# Patient Record
Sex: Male | Born: 2000 | Race: Black or African American | Hispanic: No | Marital: Single | Smoking: Never smoker
Health system: Southern US, Community
[De-identification: ages and names within clinical notes are randomized; demographics above are authoritative.]

## PROBLEM LIST (undated history)

## (undated) DIAGNOSIS — E669 Obesity, unspecified: Secondary | ICD-10-CM

## (undated) HISTORY — PX: LEG SURGERY: SHX1003

---
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
## 2019-09-29 ENCOUNTER — Ambulatory Visit: Payer: Self-pay | Admitting: Orthopedic Surgery

## 2019-10-06 ENCOUNTER — Ambulatory Visit (INDEPENDENT_AMBULATORY_CARE_PROVIDER_SITE_OTHER): Payer: Medicaid Other | Admitting: Orthopedic Surgery

## 2019-10-06 ENCOUNTER — Ambulatory Visit (INDEPENDENT_AMBULATORY_CARE_PROVIDER_SITE_OTHER): Payer: Medicaid Other

## 2019-10-06 ENCOUNTER — Other Ambulatory Visit: Payer: Self-pay

## 2019-10-06 ENCOUNTER — Encounter: Payer: Self-pay | Admitting: Orthopedic Surgery

## 2019-10-06 ENCOUNTER — Ambulatory Visit: Payer: Self-pay

## 2019-10-06 VITALS — Ht 70.0 in | Wt 380.0 lb

## 2019-10-06 DIAGNOSIS — M79605 Pain in left leg: Secondary | ICD-10-CM

## 2019-10-06 DIAGNOSIS — M79604 Pain in right leg: Secondary | ICD-10-CM

## 2019-10-06 DIAGNOSIS — M170 Bilateral primary osteoarthritis of knee: Secondary | ICD-10-CM

## 2019-10-08 ENCOUNTER — Encounter: Payer: Self-pay | Admitting: Orthopedic Surgery

## 2019-10-08 NOTE — Progress Notes (Signed)
Office Visit Note   Patient: Charles Petersen           Date of Birth: 09/10/2001           MRN: [id number] Visit Date: 10/06/2019 Requested by: Assunta Gambles, MD [address] [place],  [place] [address] PCP: Assunta Gambles, MD (Inactive)  Subjective: Chief Complaint  Patient presents with  . Right Leg - Pain  . Left Leg - Pain    HPI: Charles Petersen is a patient here for second opinion regarding his knees.  Deformity was noted by the mother when he was around age 89.  He was seen at [facility] and epiphysiodesis was performed at that time. 18  He was seen in 2017 and those notes are reviewed.  Due to his weight and high chance for complication no surgery was recommended.  His pain and deformity has increased in the past 3 years.  Currently he has 510 and 350.  Describes left greater than right sided pain.              ROS: All systems reviewed are negative as they relate to the chief complaint within the history of present illness.  Patient denies  fevers or chills.   Assessment & Plan: Visit Diagnoses:  1. Bilateral leg pain   2. Primary osteoarthritis of both knees     Plan: Impression is severe bilateral varus deformity of the knees status post epiphysiodesis done at age 18.  Patient has progressive varus deformity and increased body mass index.  Worse on the left knee than the right.  This is a significant life altering type of problem for Charles Petersen.  I am not sure if anything can be done.  Whether or not any type of talar spatial frame deformity correction could be considered is really up in the air.  I would favor surgical evaluation with Dr. Chiquita Loth or 1 of the pediatric orthopedic surgeons at [facility] to manage this very complex problem.  Follow-Up Instructions: Return if symptoms worsen or fail to improve.   Orders:  Orders Placed This Encounter  Procedures  . XR Knee 1-2 Views Right  . XR Knee 1-2 Views Left  . XR Tibia/Fibula Left  . XR Tibia/Fibula Right  . Ambulatory  referral to Orthopedic Surgery   No orders of the defined types were placed in this encounter.     Procedures: No procedures performed   Clinical Data: No additional findings.  Objective: Vital Signs: Ht 5\' 10"  (1.778 m)   Wt (!) 380 lb (172.4 kg)   BMI 54.52 kg/m   Physical Exam:   Constitutional: Patient appears well-developed HEENT:  Head: Normocephalic Eyes:EOM are normal Neck: Normal range of motion Cardiovascular: Normal rate Pulmonary/chest: Effort normal Neurologic: Patient is alert Skin: Skin is warm Psychiatric: Patient has normal mood and affect    Ortho Exam: Ortho exam demonstrates increased body mass index with severe varus deformity in both knees.  Patient lacks about 20 degrees of full extension on the left and about 10 degrees on the right.  Flexion is just barely to 90 degrees bilaterally.  Pedal pulses are palpable.  Specialty Comments:  No specialty comments available.  Imaging: No results found.   PMFS History: There are no active problems to display for this patient.  History reviewed. No pertinent past medical history.  History reviewed. No pertinent family history.  Past Surgical History:  Procedure Laterality Date  . LEG SURGERY     Social History   Occupational  History  . Not on file  Tobacco Use  . Smoking status: Never Smoker  Substance and Sexual Activity  . Alcohol use: No  . Drug use: No  . Sexual activity: Not on file

## 2020-08-16 ENCOUNTER — Emergency Department (HOSPITAL_BASED_OUTPATIENT_CLINIC_OR_DEPARTMENT_OTHER)
Admission: EM | Admit: 2020-08-16 | Discharge: 2020-08-16 | Disposition: A | Discharge status: Home / Self Care | Payer: Medicaid Other | Attending: Emergency Medicine | Admitting: Emergency Medicine

## 2020-08-16 ENCOUNTER — Other Ambulatory Visit: Payer: Self-pay

## 2020-08-16 ENCOUNTER — Encounter (HOSPITAL_BASED_OUTPATIENT_CLINIC_OR_DEPARTMENT_OTHER): Payer: Self-pay | Admitting: Emergency Medicine

## 2020-08-16 DIAGNOSIS — Z5321 Procedure and treatment not carried out due to patient leaving prior to being seen by health care provider: Secondary | ICD-10-CM | POA: Diagnosis not present

## 2020-08-16 DIAGNOSIS — R1031 Right lower quadrant pain: Secondary | ICD-10-CM | POA: Insufficient documentation

## 2020-08-16 HISTORY — DX: Obesity, unspecified: E66.9

## 2020-08-16 LAB — URINALYSIS, ROUTINE W REFLEX MICROSCOPIC
Bilirubin Urine: NEGATIVE
Glucose, UA: NEGATIVE mg/dL
Hgb urine dipstick: NEGATIVE
Ketones, ur: NEGATIVE mg/dL
Leukocytes,Ua: NEGATIVE
Nitrite: NEGATIVE
Protein, ur: NEGATIVE mg/dL
Specific Gravity, Urine: 1.025 (ref 1.005–1.030)
pH: 6 (ref 5.0–8.0)

## 2020-08-16 NOTE — ED Triage Notes (Signed)
Constant nagging pain in right flank radiating into RLQ x4  Days.  Denies any urinary sx. Denies n/v/d.

## 2021-09-17 ENCOUNTER — Encounter (HOSPITAL_BASED_OUTPATIENT_CLINIC_OR_DEPARTMENT_OTHER): Payer: Self-pay | Admitting: *Deleted

## 2021-09-17 ENCOUNTER — Emergency Department (HOSPITAL_BASED_OUTPATIENT_CLINIC_OR_DEPARTMENT_OTHER)
Admission: EM | Admit: 2021-09-17 | Discharge: 2021-09-17 | Disposition: A | Discharge status: Home / Self Care | Payer: No Typology Code available for payment source | Attending: Emergency Medicine | Admitting: Emergency Medicine

## 2021-09-17 ENCOUNTER — Other Ambulatory Visit: Payer: Self-pay

## 2021-09-17 DIAGNOSIS — R509 Fever, unspecified: Secondary | ICD-10-CM | POA: Diagnosis not present

## 2021-09-17 DIAGNOSIS — R110 Nausea: Secondary | ICD-10-CM | POA: Insufficient documentation

## 2021-09-17 DIAGNOSIS — R739 Hyperglycemia, unspecified: Secondary | ICD-10-CM | POA: Diagnosis not present

## 2021-09-17 DIAGNOSIS — E871 Hypo-osmolality and hyponatremia: Secondary | ICD-10-CM | POA: Diagnosis not present

## 2021-09-17 DIAGNOSIS — H538 Other visual disturbances: Secondary | ICD-10-CM | POA: Insufficient documentation

## 2021-09-17 DIAGNOSIS — R3589 Other polyuria: Secondary | ICD-10-CM | POA: Diagnosis not present

## 2021-09-17 DIAGNOSIS — R631 Polydipsia: Secondary | ICD-10-CM | POA: Insufficient documentation

## 2021-09-17 LAB — BASIC METABOLIC PANEL
Anion gap: 13 (ref 5–15)
BUN: 14 mg/dL (ref 6–20)
CO2: 26 mmol/L (ref 22–32)
Calcium: 9.2 mg/dL (ref 8.9–10.3)
Chloride: 100 mmol/L (ref 98–111)
Creatinine, Ser: 1.21 mg/dL (ref 0.61–1.24)
GFR, Estimated: >60 mL/min (ref >60)
Glucose, Bld: 345 mg/dL — ABNORMAL HIGH (ref 70–99)
Potassium: 4.4 mmol/L (ref 3.5–5.1)
Sodium: 139 mmol/L (ref 135–145)

## 2021-09-17 LAB — CBC WITH DIFFERENTIAL/PLATELET
Abs Immature Granulocytes: 0.03 10*3/uL (ref 0.00–0.07)
Basophils Absolute: 0 10*3/uL (ref 0.0–0.1)
Basophils Relative: 0 %
Eosinophils Absolute: 0.1 10*3/uL (ref 0.0–0.5)
Eosinophils Relative: 1 %
HCT: 48.1 % (ref 39.0–52.0)
Hemoglobin: 17.2 g/dL — ABNORMAL HIGH (ref 13.0–17.0)
Immature Granulocytes: 0 %
Lymphocytes Relative: 23 %
Lymphs Abs: 2.2 10*3/uL (ref 0.7–4.0)
MCH: 30.9 pg (ref 26.0–34.0)
MCHC: 35.8 g/dL (ref 30.0–36.0)
MCV: 86.5 fL (ref 80.0–100.0)
Monocytes Absolute: 0.5 10*3/uL (ref 0.1–1.0)
Monocytes Relative: 6 %
Neutro Abs: 6.6 10*3/uL (ref 1.7–7.7)
Neutrophils Relative %: 70 %
Platelets: 314 10*3/uL (ref 150–400)
RBC: 5.56 MIL/uL (ref 4.22–5.81)
RDW: 11.8 % (ref 11.5–15.5)
WBC: 9.5 10*3/uL (ref 4.0–10.5)
nRBC: 0 % (ref 0.0–0.2)

## 2021-09-17 LAB — COMPREHENSIVE METABOLIC PANEL
ALT: 46 U/L — ABNORMAL HIGH (ref 0–44)
AST: 29 U/L (ref 15–41)
Albumin: 4.9 g/dL (ref 3.5–5.0)
Alkaline Phosphatase: 114 U/L (ref 38–126)
Anion gap: 16 — ABNORMAL HIGH (ref 5–15)
BUN: 16 mg/dL (ref 6–20)
CO2: 27 mmol/L (ref 22–32)
Calcium: 10.9 mg/dL — ABNORMAL HIGH (ref 8.9–10.3)
Chloride: 91 mmol/L — ABNORMAL LOW (ref 98–111)
Creatinine, Ser: 1.3 mg/dL — ABNORMAL HIGH (ref 0.61–1.24)
GFR, Estimated: >60 mL/min (ref >60)
Glucose, Bld: 560 mg/dL (ref 70–99)
Potassium: 4.3 mmol/L (ref 3.5–5.1)
Sodium: 134 mmol/L — ABNORMAL LOW (ref 135–145)
Total Bilirubin: 1.2 mg/dL (ref 0.3–1.2)
Total Protein: 9 g/dL — ABNORMAL HIGH (ref 6.5–8.1)

## 2021-09-17 LAB — I-STAT VENOUS BLOOD GAS, ED
Acid-Base Excess: 3 mmol/L — ABNORMAL HIGH (ref 0.0–2.0)
Bicarbonate: 29.6 mmol/L — ABNORMAL HIGH (ref 20.0–28.0)
Calcium, Ion: 1.3 mmol/L (ref 1.15–1.40)
HCT: 51 % (ref 39.0–52.0)
Hemoglobin: 17.3 g/dL — ABNORMAL HIGH (ref 13.0–17.0)
O2 Saturation: 52 %
Potassium: 4.4 mmol/L (ref 3.5–5.1)
Sodium: 135 mmol/L (ref 135–145)
TCO2: 31 mmol/L (ref 22–32)
pCO2, Ven: 51.5 mmHg (ref 44.0–60.0)
pH, Ven: 7.368 (ref 7.250–7.430)
pO2, Ven: 29 mmHg — CL (ref 32.0–45.0)

## 2021-09-17 LAB — URINALYSIS, ROUTINE W REFLEX MICROSCOPIC
Bilirubin Urine: NEGATIVE
Glucose, UA: >=500 mg/dL — AB
Ketones, ur: 80 mg/dL — AB
Leukocytes,Ua: NEGATIVE
Nitrite: NEGATIVE
Protein, ur: NEGATIVE mg/dL
Specific Gravity, Urine: 1.015 (ref 1.005–1.030)
pH: 5 (ref 5.0–8.0)

## 2021-09-17 LAB — URINALYSIS, MICROSCOPIC (REFLEX)

## 2021-09-17 LAB — CBG MONITORING, ED
Glucose-Capillary: 589 mg/dL (ref 70–99)
Glucose-Capillary: 393 mg/dL — ABNORMAL HIGH (ref 70–99)

## 2021-09-17 LAB — BETA-HYDROXYBUTYRIC ACID: Beta-Hydroxybutyric Acid: 2.95 mmol/L — ABNORMAL HIGH (ref 0.05–0.27)

## 2021-09-17 MED ORDER — SODIUM CHLORIDE 0.9 % IV BOLUS
1000.0000 mL | Freq: Once | INTRAVENOUS | Status: AC
  Administered 2021-09-17: 1000 mL via INTRAVENOUS

## 2021-09-17 MED ORDER — INSULIN ASPART 100 UNIT/ML IJ SOLN
10.0000 [IU] | Freq: Once | INTRAMUSCULAR | Status: AC
  Administered 2021-09-17: 10 [IU] via SUBCUTANEOUS

## 2021-09-17 MED ORDER — METFORMIN HCL 500 MG PO TABS: 500.0000 mg | ORAL_TABLET | Freq: Two times a day (BID) | ORAL | 0 refills | Status: AC

## 2021-09-17 NOTE — ED Provider Notes (Signed)
[facility] EMERGENCY DEPARTMENT Provider Note   CSN: [id number] Arrival date & time: 09/17/21  0948     History Chief Complaint  Patient presents with   Blood Sugar Problem    States it was 593 this morning    Charles Petersen is a 20 y.o. male with no significant past medical history who presents to the ED due to concerns about hyperglycemia.  No history of diabetes. Previously diagnosed with prediabetes. Patient states he checked his glucose with his mother's glucometer which read over 500 earlier today.  Patient admits to polyuria, polydipsia, and blurry vision x1 week.  Denies abdominal pain and vomiting.  Admits to intermittent nausea.  Fever or chills. No treatment prior to arrival. No confusion.   History obtained from patient and past medical records. No interpreter used during encounter.       Past Medical History:  Diagnosis Date   Obesity     There are no problems to display for this patient.   Past Surgical History:  Procedure Laterality Date   LEG SURGERY         History reviewed. No pertinent family history.  Social History   Tobacco Use   Smoking status: Never   Smokeless tobacco: Never  Substance Use Topics   Alcohol use: No   Drug use: No    Home Medications Prior to Admission medications   Medication Sig Start Date End Date Taking? Authorizing Provider  metFORMIN (GLUCOPHAGE) 500 MG tablet Take 1 tablet (500 mg total) by mouth 2 (two) times daily with a meal for 60 doses. 09/17/21 10/17/21 Yes Mannie Stabile, PA-C    Allergies    Patient has no allergy information on record.  Review of Systems   Review of Systems  Constitutional:  Negative for chills and fever.  Eyes:  Positive for visual disturbance.  Gastrointestinal:  Positive for nausea. Negative for abdominal pain and vomiting.  Genitourinary:  Positive for frequency.  All other systems reviewed and are negative.  Physical Exam Updated Vital Signs BP 123/74    Pulse 95   Temp 98.4 F (36.9 C) (Oral)   Resp 18   Ht 5\' 10"  (1.778 m)   Wt (!) 167.5 kg   SpO2 97%   BMI 52.98 kg/m   Physical Exam Vitals and nursing note reviewed.  Constitutional:      General: He is not in acute distress.    Appearance: He is not ill-appearing.  HENT:     Head: Normocephalic.  Eyes:     Pupils: Pupils are equal, round, and reactive to light.  Cardiovascular:     Rate and Rhythm: Normal rate and regular rhythm.     Pulses: Normal pulses.     Heart sounds: Normal heart sounds. No murmur heard.   No friction rub. No gallop.  Pulmonary:     Effort: Pulmonary effort is normal.     Breath sounds: Normal breath sounds.  Abdominal:     General: Abdomen is flat. There is no distension.     Palpations: Abdomen is soft.     Tenderness: There is no abdominal tenderness. There is no guarding or rebound.     Comments: Abdomen soft, nondistended, nontender to palpation in all quadrants without guarding or peritoneal signs. No rebound.   Musculoskeletal:        General: Normal range of motion.     Cervical back: Neck supple.  Skin:    General: Skin is warm and dry.  Neurological:  General: No focal deficit present.     Mental Status: He is alert.  Psychiatric:        Mood and Affect: Mood normal.        Behavior: Behavior normal.    ED Results / Procedures / Treatments   Labs (all labs ordered are listed, but only abnormal results are displayed) Labs Reviewed  URINALYSIS, ROUTINE W REFLEX MICROSCOPIC - Abnormal; Notable for the following components:      Result Value   Glucose, UA >=500 (*)    Hgb urine dipstick SMALL (*)    Ketones, ur 80 (*)    All other components within normal limits  URINALYSIS, MICROSCOPIC (REFLEX) - Abnormal; Notable for the following components:   Bacteria, UA RARE (*)    All other components within normal limits  CBC WITH DIFFERENTIAL/PLATELET - Abnormal; Notable for the following components:   Hemoglobin 17.2 (*)    All  other components within normal limits  COMPREHENSIVE METABOLIC PANEL - Abnormal; Notable for the following components:   Sodium 134 (*)    Chloride 91 (*)    Glucose, Bld 560 (*)    Creatinine, Ser 1.30 (*)    Calcium 10.9 (*)    Total Protein 9.0 (*)    ALT 46 (*)    Anion gap 16 (*)    All other components within normal limits  BASIC METABOLIC PANEL - Abnormal; Notable for the following components:   Glucose, Bld 345 (*)    All other components within normal limits  CBG MONITORING, ED - Abnormal; Notable for the following components:   Glucose-Capillary 589 (*)    All other components within normal limits  I-STAT VENOUS BLOOD GAS, ED - Abnormal; Notable for the following components:   pO2, Ven 29.0 (*)    Bicarbonate 29.6 (*)    Acid-Base Excess 3.0 (*)    Hemoglobin 17.3 (*)    All other components within normal limits  CBG MONITORING, ED - Abnormal; Notable for the following components:   Glucose-Capillary 393 (*)    All other components within normal limits  BETA-HYDROXYBUTYRIC ACID    EKG None  Radiology No results found.  Procedures Procedures   Medications Ordered in ED Medications  sodium chloride 0.9 % bolus 1,000 mL (0 mLs Intravenous Stopped 09/17/21 1253)  sodium chloride 0.9 % bolus 1,000 mL (0 mLs Intravenous Stopped 09/17/21 1354)  insulin aspart (novoLOG) injection 10 Units (10 Units Subcutaneous Given 09/17/21 1534)  sodium chloride 0.9 % bolus 1,000 mL (1,000 mLs Intravenous New Bag/Given 09/17/21 1537)    ED Course  I have reviewed the triage vital signs and the nursing notes.  Pertinent labs & imaging results that were available during my care of the patient were reviewed by me and considered in my medical decision making (see chart for details).  Clinical Course as of 09/17/21 1806  Mon Sep 17, 2021  1121 Glucose-Capillary(!!): 589 [CA]  1121 Glucose, UA(!): >=500 [CA]  1121 Hgb urine dipstick(!): SMALL [CA]  1121 Ketones, ur(!): 80 [CA]   1243 Ketones, ur(!): 80 [CA]  1801 Anion gap: 13 [CA]  1801 Glucose(!): 345 [CA]    Clinical Course User Index [CA] Mannie Stabile, PA-C   MDM Rules/Calculators/A&P                          20 year old male presents to the ED due to concerns about hyperglycemia.  Patient checked his glucose with his mother's glucometer  which read over 500 just prior to arrival.  No history of diabetes however, he has been diagnosed with prediabetes.  He endorses polydipsia, polyuria, and blurry vision x1 week.  Upon arrival, patient afebrile, mildly tachycardic at 101.  Patient nontoxic-appearing.  Benign physical exam.  Abdomen soft, nondistended, nontender. Normal mentation. Hyperglycemia labs ordered to rule out DKA.  IV fluids started.  Suspect patient has underlying diabetes.  CBC reassuring with no leukocytosis.  UA significant for glucosuria, small hematuria, and ketonuria.  Rare bacteria.  VBG with normal pH.  Mild elevation in bicarb at 29.6.  CMP significant for mild hyponatremia 134, hyperglycemia at 560.  Mild anion gap of 16.  Elevated creatinine 1.3. Will give an additional L IVFs and recheck glucose level. Discussed with Dr. Durwin Nora who agrees with assessment and plan.   CBG still elevated at 393, 10 units subq insulin given.  1 L IV fluids.  We will repeat BMP to ensure anion gap has closed after treatment. Do not feel patient needs admission which Dr. Durwin Nora agrees with. Patient most likely has underlying diabetes.   BMP with closed anion gap. Low suspicion for DKA. Patient admits to improvement in symptoms after IVFs and insulin. Patient discharged with Metformin. Advised patient to follow-up with PCP this week for further evaluation. Strict ED precautions discussed with patient. Patient states understanding and agrees to plan. Patient discharged home in no acute distress and stable vitals  Final Clinical Impression(s) / ED Diagnoses Final diagnoses:  Hyperglycemia    Rx / DC Orders ED  Discharge Orders          Ordered    metFORMIN (GLUCOPHAGE) 500 MG tablet  2 times daily with meals        09/17/21 1803             Jesusita Oka 09/17/21 1836    Gloris Manchester, MD 09/17/21 1842

## 2021-09-17 NOTE — ED Notes (Signed)
BS checked 589. Patient walked to BR UA retrieved.

## 2021-09-17 NOTE — Discharge Instructions (Signed)
It was a pleasure taking care of you today. As discussed, I think you probably have underlying diabetes. Your glucose was elevated today. I am sending you home with diabetic medication. Take as prescribed. Follow-up with PCP this week for further evaluation. Return to the ER for new or worsening symptoms.

## 2021-09-17 NOTE — ED Triage Notes (Deleted)
Child alert and oriented. No complaints his time was hurting in legs and stomach last night.

## 2021-09-17 NOTE — ED Triage Notes (Signed)
States does not take any medication for his sugar.Has gotten cotton mouth, blurred vision with a little stomach pain.

## 2021-09-24 ENCOUNTER — Other Ambulatory Visit: Payer: Self-pay

## 2021-09-24 ENCOUNTER — Inpatient Hospital Stay (HOSPITAL_BASED_OUTPATIENT_CLINIC_OR_DEPARTMENT_OTHER)
Admission: EM | Admit: 2021-09-24 | Discharge: 2021-09-28 | DRG: 638 | Disposition: A | Discharge status: Home / Self Care | Payer: No Typology Code available for payment source | Attending: Internal Medicine | Admitting: Internal Medicine

## 2021-09-24 ENCOUNTER — Encounter (HOSPITAL_BASED_OUTPATIENT_CLINIC_OR_DEPARTMENT_OTHER): Payer: Self-pay | Admitting: *Deleted

## 2021-09-24 ENCOUNTER — Emergency Department (HOSPITAL_BASED_OUTPATIENT_CLINIC_OR_DEPARTMENT_OTHER): Payer: No Typology Code available for payment source

## 2021-09-24 DIAGNOSIS — Z79890 Hormone replacement therapy (postmenopausal): Secondary | ICD-10-CM

## 2021-09-24 DIAGNOSIS — I10 Essential (primary) hypertension: Secondary | ICD-10-CM | POA: Diagnosis present

## 2021-09-24 DIAGNOSIS — R030 Elevated blood-pressure reading, without diagnosis of hypertension: Secondary | ICD-10-CM

## 2021-09-24 DIAGNOSIS — E8881 Metabolic syndrome: Secondary | ICD-10-CM | POA: Diagnosis present

## 2021-09-24 DIAGNOSIS — Z23 Encounter for immunization: Secondary | ICD-10-CM

## 2021-09-24 DIAGNOSIS — N179 Acute kidney failure, unspecified: Secondary | ICD-10-CM

## 2021-09-24 DIAGNOSIS — E860 Dehydration: Secondary | ICD-10-CM | POA: Diagnosis present

## 2021-09-24 DIAGNOSIS — Z20822 Contact with and (suspected) exposure to covid-19: Secondary | ICD-10-CM | POA: Diagnosis present

## 2021-09-24 DIAGNOSIS — E1110 Type 2 diabetes mellitus with ketoacidosis without coma: Principal | ICD-10-CM | POA: Diagnosis present

## 2021-09-24 DIAGNOSIS — Z6841 Body Mass Index (BMI) 40.0 and over, adult: Secondary | ICD-10-CM

## 2021-09-24 DIAGNOSIS — Z833 Family history of diabetes mellitus: Secondary | ICD-10-CM

## 2021-09-24 DIAGNOSIS — Z794 Long term (current) use of insulin: Secondary | ICD-10-CM

## 2021-09-24 LAB — URINALYSIS, MICROSCOPIC (REFLEX)

## 2021-09-24 LAB — URINALYSIS, ROUTINE W REFLEX MICROSCOPIC
Bilirubin Urine: NEGATIVE
Glucose, UA: >=500 mg/dL — AB
Ketones, ur: >=80 mg/dL — AB
Leukocytes,Ua: NEGATIVE
Nitrite: NEGATIVE
Protein, ur: 30 mg/dL — AB
Specific Gravity, Urine: 1.03 (ref 1.005–1.030)
pH: 6 (ref 5.0–8.0)

## 2021-09-24 LAB — I-STAT VENOUS BLOOD GAS, ED
Acid-base deficit: 14 mmol/L — ABNORMAL HIGH (ref 0.0–2.0)
Bicarbonate: 13.7 mmol/L — ABNORMAL LOW (ref 20.0–28.0)
Calcium, Ion: 1.21 mmol/L (ref 1.15–1.40)
HCT: 52 % (ref 39.0–52.0)
Hemoglobin: 17.7 g/dL — ABNORMAL HIGH (ref 13.0–17.0)
O2 Saturation: 49 %
Patient temperature: 98.1
Potassium: 5.2 mmol/L — ABNORMAL HIGH (ref 3.5–5.1)
Sodium: 139 mmol/L (ref 135–145)
TCO2: 15 mmol/L — ABNORMAL LOW (ref 22–32)
pCO2, Ven: 37.9 mmHg — ABNORMAL LOW (ref 44.0–60.0)
pH, Ven: 7.166 — CL (ref 7.250–7.430)
pO2, Ven: 32 mmHg (ref 32.0–45.0)

## 2021-09-24 LAB — CBC WITH DIFFERENTIAL/PLATELET
Abs Immature Granulocytes: 0.08 10*3/uL — ABNORMAL HIGH (ref 0.00–0.07)
Basophils Absolute: 0.1 10*3/uL (ref 0.0–0.1)
Basophils Relative: 0 %
Eosinophils Absolute: 0 10*3/uL (ref 0.0–0.5)
Eosinophils Relative: 0 %
HCT: 52.1 % — ABNORMAL HIGH (ref 39.0–52.0)
Hemoglobin: 17.8 g/dL — ABNORMAL HIGH (ref 13.0–17.0)
Immature Granulocytes: 1 %
Lymphocytes Relative: 11 %
Lymphs Abs: 1.3 10*3/uL (ref 0.7–4.0)
MCH: 30.6 pg (ref 26.0–34.0)
MCHC: 34.2 g/dL (ref 30.0–36.0)
MCV: 89.7 fL (ref 80.0–100.0)
Monocytes Absolute: 0.8 10*3/uL (ref 0.1–1.0)
Monocytes Relative: 6 %
Neutro Abs: 9.7 10*3/uL — ABNORMAL HIGH (ref 1.7–7.7)
Neutrophils Relative %: 82 %
Platelets: 373 10*3/uL (ref 150–400)
RBC: 5.81 MIL/uL (ref 4.22–5.81)
RDW: 12.6 % (ref 11.5–15.5)
WBC: 11.8 10*3/uL — ABNORMAL HIGH (ref 4.0–10.5)
nRBC: 0 % (ref 0.0–0.2)

## 2021-09-24 LAB — GLUCOSE, CAPILLARY
Glucose-Capillary: 261 mg/dL — ABNORMAL HIGH (ref 70–99)
Glucose-Capillary: 350 mg/dL — ABNORMAL HIGH (ref 70–99)

## 2021-09-24 LAB — BASIC METABOLIC PANEL
Anion gap: 24 — ABNORMAL HIGH (ref 5–15)
BUN: 20 mg/dL (ref 6–20)
CO2: 13 mmol/L — ABNORMAL LOW (ref 22–32)
Calcium: 10.5 mg/dL — ABNORMAL HIGH (ref 8.9–10.3)
Chloride: 107 mmol/L (ref 98–111)
Creatinine, Ser: 1.22 mg/dL (ref 0.61–1.24)
GFR, Estimated: >60 mL/min (ref >60)
Glucose, Bld: 336 mg/dL — ABNORMAL HIGH (ref 70–99)
Potassium: 4.4 mmol/L (ref 3.5–5.1)
Sodium: 144 mmol/L (ref 135–145)

## 2021-09-24 LAB — CBG MONITORING, ED
Glucose-Capillary: 580 mg/dL (ref 70–99)
Glucose-Capillary: 556 mg/dL (ref 70–99)
Glucose-Capillary: 486 mg/dL — ABNORMAL HIGH (ref 70–99)
Glucose-Capillary: 442 mg/dL — ABNORMAL HIGH (ref 70–99)
Glucose-Capillary: 401 mg/dL — ABNORMAL HIGH (ref 70–99)

## 2021-09-24 LAB — LACTIC ACID, PLASMA: Lactic Acid, Venous: 3.3 mmol/L (ref 0.5–1.9)

## 2021-09-24 LAB — COMPREHENSIVE METABOLIC PANEL
ALT: 38 U/L (ref 0–44)
AST: 19 U/L (ref 15–41)
Albumin: 4.9 g/dL (ref 3.5–5.0)
Alkaline Phosphatase: 114 U/L (ref 38–126)
Anion gap: 24 — ABNORMAL HIGH (ref 5–15)
BUN: 21 mg/dL — ABNORMAL HIGH (ref 6–20)
CO2: 15 mmol/L — ABNORMAL LOW (ref 22–32)
Calcium: 10.5 mg/dL — ABNORMAL HIGH (ref 8.9–10.3)
Chloride: 97 mmol/L — ABNORMAL LOW (ref 98–111)
Creatinine, Ser: 1.61 mg/dL — ABNORMAL HIGH (ref 0.61–1.24)
GFR, Estimated: >60 mL/min (ref >60)
Glucose, Bld: 605 mg/dL (ref 70–99)
Potassium: 5.1 mmol/L (ref 3.5–5.1)
Sodium: 136 mmol/L (ref 135–145)
Total Bilirubin: 1.6 mg/dL — ABNORMAL HIGH (ref 0.3–1.2)
Total Protein: 9.5 g/dL — ABNORMAL HIGH (ref 6.5–8.1)

## 2021-09-24 LAB — BLOOD GAS, VENOUS
Acid-base deficit: 13.7 mmol/L — ABNORMAL HIGH (ref 0.0–2.0)
Bicarbonate: 15.5 mmol/L — ABNORMAL LOW (ref 20.0–28.0)
O2 Saturation: 39.9 %
Patient temperature: 98.6
pCO2, Ven: 45.3 mmHg (ref 44.0–60.0)
pH, Ven: 7.159 — CL (ref 7.250–7.430)
pO2, Ven: 27.4 mmHg — CL (ref 32.0–45.0)

## 2021-09-24 LAB — PROCALCITONIN: Procalcitonin: <0.1 ng/mL

## 2021-09-24 LAB — RESP PANEL BY RT-PCR (FLU A&B, COVID) ARPGX2
Influenza A by PCR: NEGATIVE
Influenza B by PCR: NEGATIVE
SARS Coronavirus 2 by RT PCR: NEGATIVE

## 2021-09-24 LAB — OSMOLALITY: Osmolality: 342 mOsm/kg (ref 275–295)

## 2021-09-24 LAB — LIPASE, BLOOD: Lipase: 35 U/L (ref 11–51)

## 2021-09-24 MED ORDER — ONDANSETRON HCL 4 MG/2ML IJ SOLN: 4.0000 mg | Freq: Four times a day (QID) | INTRAMUSCULAR | Status: DC | PRN

## 2021-09-24 MED ORDER — LACTATED RINGERS IV BOLUS
1000.0000 mL | Freq: Once | INTRAVENOUS | Status: AC
  Administered 2021-09-24: 1000 mL via INTRAVENOUS

## 2021-09-24 MED ORDER — LACTATED RINGERS IV SOLN: INTRAVENOUS | Status: DC

## 2021-09-24 MED ORDER — INSULIN REGULAR(HUMAN) IN NACL 100-0.9 UT/100ML-% IV SOLN
INTRAVENOUS | Status: DC
  Administered 2021-09-24: 15 [IU]/h via INTRAVENOUS
  Administered 2021-09-25: 13 [IU]/h via INTRAVENOUS
  Administered 2021-09-25: 30 [IU]/h via INTRAVENOUS
  Administered 2021-09-25: 7.5 [IU]/h via INTRAVENOUS
  Administered 2021-09-25: 30 [IU]/h via INTRAVENOUS
  Administered 2021-09-26: 12 [IU]/h via INTRAVENOUS
  Filled 2021-09-24 (×9): qty 100

## 2021-09-24 MED ORDER — DEXTROSE 50 % IV SOLN: 0.0000 mL | INTRAVENOUS | Status: DC | PRN

## 2021-09-24 MED ORDER — FAMOTIDINE IN NACL 20-0.9 MG/50ML-% IV SOLN
20.0000 mg | Freq: Once | INTRAVENOUS | Status: AC
  Administered 2021-09-24: 20 mg via INTRAVENOUS
  Filled 2021-09-24: qty 50

## 2021-09-24 MED ORDER — SODIUM CHLORIDE 0.9 % IV BOLUS: 1000.0000 mL | Freq: Once | INTRAVENOUS | Status: DC

## 2021-09-24 MED ORDER — CHLORHEXIDINE GLUCONATE CLOTH 2 % EX PADS
6.0000 | MEDICATED_PAD | Freq: Every day | CUTANEOUS | Status: DC
  Administered 2021-09-24 – 2021-09-26 (×2): 6 via TOPICAL

## 2021-09-24 MED ORDER — DEXTROSE IN LACTATED RINGERS 5 % IV SOLN: INTRAVENOUS | Status: DC

## 2021-09-24 MED ORDER — ENOXAPARIN SODIUM 80 MG/0.8ML IJ SOSY
80.0000 mg | PREFILLED_SYRINGE | INTRAMUSCULAR | Status: DC
  Administered 2021-09-25 – 2021-09-27 (×3): 80 mg via SUBCUTANEOUS
  Filled 2021-09-24 (×3): qty 0.8

## 2021-09-24 MED ORDER — ONDANSETRON HCL 4 MG/2ML IJ SOLN
4.0000 mg | Freq: Once | INTRAMUSCULAR | Status: AC
  Administered 2021-09-24: 4 mg via INTRAVENOUS
  Filled 2021-09-24: qty 2

## 2021-09-24 MED ORDER — LACTATED RINGERS IV BOLUS
1000.0000 mL | Freq: Once | INTRAVENOUS | Status: AC
  Administered 2021-09-25: 1000 mL via INTRAVENOUS

## 2021-09-24 NOTE — ED Notes (Signed)
Checked CBG 401, RN Alfred informed

## 2021-09-24 NOTE — ED Provider Notes (Signed)
[facility] EMERGENCY DEPARTMENT Provider Note   CSN: [id number] Arrival date & time: 09/24/21  1633     History Chief Complaint  Patient presents with   Vomiting    Charles Petersen is a 20 y.o. male with history of hyperglycemia on metformin presents to the ED for evaluation of nausea and vomiting for the past 3 days with dizziness and epigastric abdominal pain for 2 days.  Patient was recently seen here last week and diagnosed with hyperglycemia and put on metformin.  Patient said his symptoms started shortly after taking the metformin.  Epigastric abdominal pain has no exacerbating or relieving factors and does not radiate.  He denies any weakness, chest pain, shortness breath, fevers, chills, diarrhea, hematemesis, dysuria, or hematuria.  He reports he is unable to tolerate PO.  Additionally, his dizziness occurs with change in position from sitting to standing.  No LOC or syncope. Denies any polyuria or polydipsia.  No known drug allergies.  Patient denies any tobacco, EtOH, or drug use.  HPI   Past Medical History:  Diagnosis Date   Obesity     Patient Active Problem List   Diagnosis Date Noted   Diabetic ketoacidosis (HCC) 09/24/2021    Past Surgical History:  Procedure Laterality Date   LEG SURGERY         No family history on file.  Social History   Tobacco Use   Smoking status: Never   Smokeless tobacco: Never  Substance Use Topics   Alcohol use: No   Drug use: No    Home Medications Prior to Admission medications   Medication Sig Start Date End Date Taking? Authorizing Provider  metFORMIN (GLUCOPHAGE) 500 MG tablet Take 1 tablet (500 mg total) by mouth 2 (two) times daily with a meal for 60 doses. 09/17/21 10/17/21  Mannie Stabile, PA-C    Allergies    Patient has no known allergies.  Review of Systems   Review of Systems  Constitutional:  Negative for chills and fever.  HENT:  Negative for ear pain and sore throat.   Eyes:  Negative  for pain and visual disturbance.  Respiratory:  Negative for cough and shortness of breath.   Cardiovascular:  Negative for chest pain and palpitations.  Gastrointestinal:  Positive for abdominal pain, constipation, nausea and vomiting. Negative for diarrhea.  Endocrine: Negative for polydipsia and polyuria.  Genitourinary:  Negative for dysuria and hematuria.  Musculoskeletal:  Negative for arthralgias and back pain.  Skin:  Negative for color change and rash.  Neurological:  Positive for dizziness. Negative for seizures, syncope, weakness, numbness and headaches.  All other systems reviewed and are negative.  Physical Exam Updated Vital Signs BP (!) 142/99   Pulse (!) 109   Temp 98.1 F (36.7 C) (Oral)   Resp 16   Ht 5\' 10"  (1.778 m)   Wt (!) 163.3 kg   SpO2 99%   BMI 51.65 kg/m   Physical Exam Vitals and nursing note reviewed.  Constitutional:      General: He is not in acute distress.    Appearance: Normal appearance. He is not ill-appearing or toxic-appearing.  HENT:     Head: Normocephalic and atraumatic.     Mouth/Throat:     Mouth: Mucous membranes are dry.     Pharynx: No oropharyngeal exudate or posterior oropharyngeal erythema.  Eyes:     General: No scleral icterus. Cardiovascular:     Rate and Rhythm: Regular rhythm. Tachycardia present.  Pulmonary:  Effort: Pulmonary effort is normal. No respiratory distress.     Breath sounds: Normal breath sounds. No wheezing.  Abdominal:     General: Abdomen is flat. Bowel sounds are normal.     Palpations: Abdomen is soft.     Tenderness: There is abdominal tenderness. There is no right CVA tenderness, left CVA tenderness, guarding or rebound.     Comments: Abdominal exam limited secondary to body habitus.  No overlying skin changes, rashes, erythema, or ecchymosis noted.  Mild epigastric tenderness to palpation.  Musculoskeletal:        General: No deformity.     Cervical back: Normal range of motion.  Skin:     General: Skin is warm and dry.  Neurological:     General: No focal deficit present.     Mental Status: He is alert. Mental status is at baseline.     Motor: No weakness.    ED Results / Procedures / Treatments   Labs (all labs ordered are listed, but only abnormal results are displayed) Labs Reviewed  URINALYSIS, ROUTINE W REFLEX MICROSCOPIC - Abnormal; Notable for the following components:      Result Value   Color, Urine STRAW (*)    Glucose, UA >=500 (*)    Hgb urine dipstick MODERATE (*)    Ketones, ur >=80 (*)    Protein, ur 30 (*)    All other components within normal limits  COMPREHENSIVE METABOLIC PANEL - Abnormal; Notable for the following components:   Chloride 97 (*)    CO2 15 (*)    Glucose, Bld 605 (*)    BUN 21 (*)    Creatinine, Ser 1.61 (*)    Calcium 10.5 (*)    Total Protein 9.5 (*)    Total Bilirubin 1.6 (*)    Anion gap 24 (*)    All other components within normal limits  CBC WITH DIFFERENTIAL/PLATELET - Abnormal; Notable for the following components:   WBC 11.8 (*)    Hemoglobin 17.8 (*)    HCT 52.1 (*)    Neutro Abs 9.7 (*)    Abs Immature Granulocytes 0.08 (*)    All other components within normal limits  URINALYSIS, MICROSCOPIC (REFLEX) - Abnormal; Notable for the following components:   Bacteria, UA RARE (*)    All other components within normal limits  CBG MONITORING, ED - Abnormal; Notable for the following components:   Glucose-Capillary 580 (*)    All other components within normal limits  I-STAT VENOUS BLOOD GAS, ED - Abnormal; Notable for the following components:   pH, Ven 7.166 (*)    pCO2, Ven 37.9 (*)    Bicarbonate 13.7 (*)    TCO2 15 (*)    Acid-base deficit 14.0 (*)    Potassium 5.2 (*)    Hemoglobin 17.7 (*)    All other components within normal limits  CBG MONITORING, ED - Abnormal; Notable for the following components:   Glucose-Capillary 556 (*)    All other components within normal limits  CBG MONITORING, ED -  Abnormal; Notable for the following components:   Glucose-Capillary 486 (*)    All other components within normal limits  CBG MONITORING, ED - Abnormal; Notable for the following components:   Glucose-Capillary 442 (*)    All other components within normal limits  CULTURE, BLOOD (ROUTINE X 2)  CULTURE, BLOOD (ROUTINE X 2)  LIPASE, BLOOD  BLOOD GAS, VENOUS  OSMOLALITY  LACTIC ACID, PLASMA  LACTIC ACID, PLASMA  PROCALCITONIN  BETA-HYDROXYBUTYRIC ACID    EKG EKG Interpretation  Date/Time:  Monday September 24 2021 18:42:16 EDT Ventricular Rate:  115 PR Interval:  144 QRS Duration: 86 QT Interval:  369 QTC Calculation: 511 R Axis:   93 Text Interpretation: Sinus tachycardia Borderline right axis deviation Abnormal T, consider ischemia, inferior leads Prolonged QT interval Baseline wander in lead(s) V1 Confirmed by Vanetta Mulders [phone]) on 09/24/2021 6:45:01 PM  Radiology DG Chest 2 View  Result Date: 09/24/2021 CLINICAL DATA:  Nausea, vomiting, dizziness EXAM: CHEST - 2 VIEW COMPARISON:  None. FINDINGS: The cardiomediastinal silhouette is normal. There is no focal consolidation or pulmonary edema. There is no pleural effusion or pneumothorax. There is no acute osseous abnormality. IMPRESSION: No radiographic evidence of acute cardiopulmonary process. Electronically Signed   By: Lesia Hausen M.D.   On: 09/24/2021 20:39    Procedures .Critical Care Performed by: Achille Rich, PA-C Authorized by: Achille Rich, PA-C   Critical care provider statement:    Critical care time (minutes):  60   Critical care was necessary to treat or prevent imminent or life-threatening deterioration of the following conditions:  Endocrine crisis   Critical care was time spent personally by me on the following activities:  Development of treatment plan with patient or surrogate, evaluation of patient's response to treatment, examination of patient, obtaining history from patient or surrogate, review  of old charts, re-evaluation of patient's condition, pulse oximetry, ordering and review of radiographic studies, ordering and review of laboratory studies and ordering and performing treatments and interventions   Care discussed with: admitting provider     Medications Ordered in ED Medications  insulin regular, human (MYXREDLIN) 100 units/ 100 mL infusion (15 Units/hr Intravenous New Bag/Given 09/24/21 1938)  lactated ringers infusion ( Intravenous New Bag/Given 09/24/21 2031)  dextrose 5 % in lactated ringers infusion (has no administration in time range)  dextrose 50 % solution 0-50 mL (has no administration in time range)  ondansetron (ZOFRAN) injection 4 mg (4 mg Intravenous Given 09/24/21 1816)  lactated ringers bolus 1,000 mL ( Intravenous Stopped 09/24/21 1914)  famotidine (PEPCID) IVPB 20 mg premix (0 mg Intravenous Stopped 09/24/21 1839)  lactated ringers bolus 1,000 mL ( Intravenous Stopped 09/24/21 2012)    ED Course  I have reviewed the triage vital signs and the nursing notes.  Pertinent labs & imaging results that were available during my care of the patient were reviewed by me and considered in my medical decision making (see chart for details).  Charles Petersen is a 20 year old recently diagnosed type II diabetic presenting for 3 days of nausea vomiting and 2 days of epigastric abdominal pain.  The patient was recently seen here on 09-17-2021 and diagnosed with hyperglycemia and put on metformin.  During ED 09-17-2021 visit, previous labs show hyperglycemia with glucose above 500 and anion gap of 16.  Fluids were given and anion gap improved to 13.  Patient was discharged on metformin. patient reports he has been compliant with his new prescription.  Denies any polyuria or polydipsia.  I personally reviewed and interpreted the patient's labs and imaging.  His initial CBG was 580.  CMP shows BUN and creatinine elevated at 1.61 revealing an AKI.  Anion gap of 24.  CBC is possibly  hemoconcentrated due to patient's dehydration as it shows elevated hemoglobin and hematocrit as well as white blood cell count.  Patient is afebrile, infection less likely.  Urinalysis shows greater than 500 glucose with greater than 80 ketones, negative nitrite, leukocyte,  or white blood cell count.  This patient does not have a UTI.  VBG shows the patient is acidotic with pH of 7.116 bicarb of 13.7.  Potassium is 5.2.  Chest x-ray shows no acute cardiopulmonary process.  Based on patient's lab results and presentation the DKA order set will be ordered and LR fluid boluses initiated.  During his ED visit, he became anxious and tachycardic to the 140s.  And went to evaluate the patient and he was very tearful stating that he was anxious and scared.  I was able to calm down the patient and vital signs improved.  Patient was given 2 L of lactated Ringer's with the third liter running currently.  Insulin drip has been initiated and patient's blood glucose continues to improve with the last reading of 486.  On reevaluation, patient is lying calmly and comfortably in bed with no complaints.  Exam is still reassuring.  After discussing admission with Dr. Leafy Half, he requested a chest x-ray, procalcitonin, lactic acid, and blood cultures on the patient.  Orders were placed.  Earlier I discussed the need for admission because of the diabetic ketoacidosis with the patient and his mom.  Patient and mom are agreeable to plan and would like admission at [facility].  Admit to Hospital Team, Dr. Shauna Hugh.   MDM Rules/Calculators/A&P                          Final Clinical Impression(s) / ED Diagnoses Final diagnoses:  Diabetic ketoacidosis without coma associated with type 2 diabetes mellitus (HCC)  AKI (acute kidney injury) [facility])    Rx / DC Orders ED Discharge Orders     None        Achille Rich, PA-C 09/24/21 2108    Vanetta Mulders, MD 10/01/21 1324

## 2021-09-24 NOTE — ED Notes (Signed)
Checked CBG 556, RN Alfred informed

## 2021-09-24 NOTE — ED Notes (Signed)
Checked CBG 486, RN Alfred informed

## 2021-09-24 NOTE — ED Triage Notes (Signed)
C/o n/v x 3 days dizziness x 2 days

## 2021-09-24 NOTE — Progress Notes (Signed)
HOSPITAL MEDICINE OVERNIGHT EVENT NOTE    Patient presented to a freestanding ER with a diagnosis of DKA makes patient a telemedicine pilot candidate.  Per my discussion with patient placement there is an immediate bed available at [facility] stepdown unit.  Additionally, the patient has voiced his desire to be admitted at [facility] and therefore we will proceed without telemedicine consultation.  Marinda Elk  MD [facility]

## 2021-09-25 DIAGNOSIS — N179 Acute kidney failure, unspecified: Secondary | ICD-10-CM

## 2021-09-25 DIAGNOSIS — R030 Elevated blood-pressure reading, without diagnosis of hypertension: Secondary | ICD-10-CM | POA: Diagnosis not present

## 2021-09-25 DIAGNOSIS — Z833 Family history of diabetes mellitus: Secondary | ICD-10-CM | POA: Diagnosis not present

## 2021-09-25 DIAGNOSIS — Z20822 Contact with and (suspected) exposure to covid-19: Secondary | ICD-10-CM | POA: Diagnosis present

## 2021-09-25 DIAGNOSIS — E8881 Metabolic syndrome: Secondary | ICD-10-CM | POA: Diagnosis present

## 2021-09-25 DIAGNOSIS — E1110 Type 2 diabetes mellitus with ketoacidosis without coma: Secondary | ICD-10-CM | POA: Diagnosis present

## 2021-09-25 DIAGNOSIS — Z23 Encounter for immunization: Secondary | ICD-10-CM | POA: Diagnosis not present

## 2021-09-25 DIAGNOSIS — E860 Dehydration: Secondary | ICD-10-CM | POA: Diagnosis present

## 2021-09-25 DIAGNOSIS — Z6841 Body Mass Index (BMI) 40.0 and over, adult: Secondary | ICD-10-CM

## 2021-09-25 DIAGNOSIS — Z794 Long term (current) use of insulin: Secondary | ICD-10-CM | POA: Diagnosis not present

## 2021-09-25 DIAGNOSIS — I10 Essential (primary) hypertension: Secondary | ICD-10-CM | POA: Diagnosis present

## 2021-09-25 DIAGNOSIS — Z79890 Hormone replacement therapy (postmenopausal): Secondary | ICD-10-CM | POA: Diagnosis not present

## 2021-09-25 LAB — HEMOGLOBIN A1C
Hgb A1c MFr Bld: 11.4 % — ABNORMAL HIGH (ref 4.8–5.6)
Mean Plasma Glucose: 280.48 mg/dL

## 2021-09-25 LAB — BASIC METABOLIC PANEL
Anion gap: 19 — ABNORMAL HIGH (ref 5–15)
Anion gap: 17 — ABNORMAL HIGH (ref 5–15)
Anion gap: 13 (ref 5–15)
Anion gap: 9 (ref 5–15)
BUN: 18 mg/dL (ref 6–20)
BUN: 19 mg/dL (ref 6–20)
BUN: 15 mg/dL (ref 6–20)
BUN: 15 mg/dL (ref 6–20)
CO2: 14 mmol/L — ABNORMAL LOW (ref 22–32)
CO2: 15 mmol/L — ABNORMAL LOW (ref 22–32)
CO2: 19 mmol/L — ABNORMAL LOW (ref 22–32)
CO2: 21 mmol/L — ABNORMAL LOW (ref 22–32)
Calcium: 10 mg/dL (ref 8.9–10.3)
Calcium: 9.9 mg/dL (ref 8.9–10.3)
Calcium: 9.3 mg/dL (ref 8.9–10.3)
Calcium: 9.2 mg/dL (ref 8.9–10.3)
Chloride: 111 mmol/L (ref 98–111)
Chloride: 113 mmol/L — ABNORMAL HIGH (ref 98–111)
Chloride: 105 mmol/L (ref 98–111)
Chloride: 107 mmol/L (ref 98–111)
Creatinine, Ser: 1.28 mg/dL — ABNORMAL HIGH (ref 0.61–1.24)
Creatinine, Ser: 1.17 mg/dL (ref 0.61–1.24)
Creatinine, Ser: 1.06 mg/dL (ref 0.61–1.24)
Creatinine, Ser: 0.9 mg/dL (ref 0.61–1.24)
GFR, Estimated: >60 mL/min (ref >60)
GFR, Estimated: >60 mL/min (ref >60)
GFR, Estimated: >60 mL/min (ref >60)
GFR, Estimated: >60 mL/min (ref >60)
Glucose, Bld: 223 mg/dL — ABNORMAL HIGH (ref 70–99)
Glucose, Bld: 208 mg/dL — ABNORMAL HIGH (ref 70–99)
Glucose, Bld: 313 mg/dL — ABNORMAL HIGH (ref 70–99)
Glucose, Bld: 350 mg/dL — ABNORMAL HIGH (ref 70–99)
Potassium: 4.2 mmol/L (ref 3.5–5.1)
Potassium: 4.1 mmol/L (ref 3.5–5.1)
Potassium: 4.1 mmol/L (ref 3.5–5.1)
Potassium: 3.3 mmol/L — ABNORMAL LOW (ref 3.5–5.1)
Sodium: 144 mmol/L (ref 135–145)
Sodium: 145 mmol/L (ref 135–145)
Sodium: 137 mmol/L (ref 135–145)
Sodium: 137 mmol/L (ref 135–145)

## 2021-09-25 LAB — GLUCOSE, CAPILLARY
Glucose-Capillary: 197 mg/dL — ABNORMAL HIGH (ref 70–99)
Glucose-Capillary: 198 mg/dL — ABNORMAL HIGH (ref 70–99)
Glucose-Capillary: 214 mg/dL — ABNORMAL HIGH (ref 70–99)
Glucose-Capillary: 222 mg/dL — ABNORMAL HIGH (ref 70–99)
Glucose-Capillary: 228 mg/dL — ABNORMAL HIGH (ref 70–99)
Glucose-Capillary: 229 mg/dL — ABNORMAL HIGH (ref 70–99)
Glucose-Capillary: 230 mg/dL — ABNORMAL HIGH (ref 70–99)
Glucose-Capillary: 230 mg/dL — ABNORMAL HIGH (ref 70–99)
Glucose-Capillary: 238 mg/dL — ABNORMAL HIGH (ref 70–99)
Glucose-Capillary: 243 mg/dL — ABNORMAL HIGH (ref 70–99)
Glucose-Capillary: 252 mg/dL — ABNORMAL HIGH (ref 70–99)
Glucose-Capillary: 262 mg/dL — ABNORMAL HIGH (ref 70–99)
Glucose-Capillary: 263 mg/dL — ABNORMAL HIGH (ref 70–99)
Glucose-Capillary: 270 mg/dL — ABNORMAL HIGH (ref 70–99)
Glucose-Capillary: 284 mg/dL — ABNORMAL HIGH (ref 70–99)
Glucose-Capillary: 303 mg/dL — ABNORMAL HIGH (ref 70–99)
Glucose-Capillary: 310 mg/dL — ABNORMAL HIGH (ref 70–99)
Glucose-Capillary: 328 mg/dL — ABNORMAL HIGH (ref 70–99)
Glucose-Capillary: 330 mg/dL — ABNORMAL HIGH (ref 70–99)
Glucose-Capillary: 334 mg/dL — ABNORMAL HIGH (ref 70–99)
Glucose-Capillary: 346 mg/dL — ABNORMAL HIGH (ref 70–99)
Glucose-Capillary: 347 mg/dL — ABNORMAL HIGH (ref 70–99)
Glucose-Capillary: 348 mg/dL — ABNORMAL HIGH (ref 70–99)

## 2021-09-25 LAB — MRSA NEXT GEN BY PCR, NASAL: MRSA by PCR Next Gen: NOT DETECTED

## 2021-09-25 LAB — BETA-HYDROXYBUTYRIC ACID: Beta-Hydroxybutyric Acid: >8 mmol/L — ABNORMAL HIGH (ref 0.05–0.27)

## 2021-09-25 LAB — HIV ANTIBODY (ROUTINE TESTING W REFLEX): HIV Screen 4th Generation wRfx: NONREACTIVE

## 2021-09-25 LAB — LACTIC ACID, PLASMA: Lactic Acid, Venous: 1.9 mmol/L (ref 0.5–1.9)

## 2021-09-25 MED ORDER — POTASSIUM CHLORIDE CRYS ER 20 MEQ PO TBCR
40.0000 meq | EXTENDED_RELEASE_TABLET | Freq: Once | ORAL | Status: AC
  Administered 2021-09-25: 40 meq via ORAL
  Filled 2021-09-25: qty 2

## 2021-09-25 MED ORDER — LIVING WELL WITH DIABETES BOOK
Freq: Once | Status: AC
  Filled 2021-09-25: qty 1

## 2021-09-25 MED ORDER — INSULIN STARTER KIT- PEN NEEDLES (ENGLISH)
1.0000 | Freq: Once | Status: AC
  Administered 2021-09-25: 1
  Filled 2021-09-25: qty 1

## 2021-09-25 NOTE — Plan of Care (Signed)
  Problem: Education: Goal: Knowledge of General Education information will improve Description: Including pain rating scale, medication(s)/side effects and non-pharmacologic comfort measures 09/25/2021 0354 by Sherri Rad, RN Outcome: Progressing 09/25/2021 0347 by Sherri Rad, RN Outcome: Progressing   Problem: Health Behavior/Discharge Planning: Goal: Ability to manage health-related needs will improve 09/25/2021 0354 by Sherri Rad, RN Outcome: Progressing 09/25/2021 0347 by Sherri Rad, RN Outcome: Progressing   Problem: Clinical Measurements: Goal: Ability to maintain clinical measurements within normal limits will improve 09/25/2021 0354 by Sherri Rad, RN Outcome: Progressing 09/25/2021 0347 by Sherri Rad, RN Outcome: Progressing Goal: Will remain free from infection 09/25/2021 0354 by Sherri Rad, RN Outcome: Progressing 09/25/2021 0347 by Sherri Rad, RN Outcome: Progressing Goal: Diagnostic test results will improve 09/25/2021 0354 by Sherri Rad, RN Outcome: Progressing 09/25/2021 0347 by Sherri Rad, RN Outcome: Progressing Goal: Respiratory complications will improve 09/25/2021 0354 by Sherri Rad, RN Outcome: Progressing 09/25/2021 0347 by Sherri Rad, RN Outcome: Progressing Goal: Cardiovascular complication will be avoided 09/25/2021 0354 by Sherri Rad, RN Outcome: Progressing 09/25/2021 0347 by Sherri Rad, RN Outcome: Progressing   Problem: Activity: Goal: Risk for activity intolerance will decrease 09/25/2021 0354 by Sherri Rad, RN Outcome: Progressing 09/25/2021 0347 by Sherri Rad, RN Outcome: Progressing   Problem: Nutrition: Goal: Adequate nutrition will be maintained 09/25/2021 0354 by Sherri Rad, RN Outcome: Progressing 09/25/2021 0347 by Sherri Rad, RN Outcome: Progressing   Problem: Coping: Goal: Level of anxiety will decrease 09/25/2021  0354 by Sherri Rad, RN Outcome: Progressing 09/25/2021 0347 by Sherri Rad, RN Outcome: Progressing   Problem: Elimination: Goal: Will not experience complications related to bowel motility 09/25/2021 0354 by Sherri Rad, RN Outcome: Progressing 09/25/2021 0347 by Sherri Rad, RN Outcome: Progressing Goal: Will not experience complications related to urinary retention 09/25/2021 0354 by Sherri Rad, RN Outcome: Progressing 09/25/2021 0347 by Sherri Rad, RN Outcome: Progressing   Problem: Pain Managment: Goal: General experience of comfort will improve 09/25/2021 0354 by Sherri Rad, RN Outcome: Progressing 09/25/2021 0347 by Sherri Rad, RN Outcome: Progressing   Problem: Safety: Goal: Ability to remain free from injury will improve 09/25/2021 0354 by Sherri Rad, RN Outcome: Progressing 09/25/2021 0347 by Sherri Rad, RN Outcome: Progressing   Problem: Skin Integrity: Goal: Risk for impaired skin integrity will decrease 09/25/2021 0354 by Sherri Rad, RN Outcome: Progressing 09/25/2021 0347 by Sherri Rad, RN Outcome: Progressing

## 2021-09-25 NOTE — TOC Initial Note (Signed)
Transition of Care [facility]) - Initial/Assessment Note    Patient Details  Name: Charles Petersen MRN: [id number] Date of Birth: [birth year]  Transition of Care [facility]) CM/SW Contact:    [name], RN Phone Number: 09/25/2021, 7:41 AM  Clinical Narrative:                 20 y.o. male with medical history significant for morbid obesity, prediabetes on metformin who presents with nausea, vomiting abdominal pain.  Reports that he had 4 days of nausea, vomiting and diffuse abdominal pain.  Had nonbilious nonbloody emesis about twice a day.  Has been able to take fluids but not much food.  Notes diffuse abdominal pain worse with vomiting.  Denies any fever.  No previous abdominal surgeries.  No new foods or recent travels.   ED Course: He was afebrile, tachycardic and mildly hypertensive to systolic of 140s.  No leukocytosis, hemoglobin of 17.8.  Sodium 136, potassium of 5.1, creatinine of 1.61 from a prior of 1.21. BG of 605 with gap of 29, pH of 7.16 and bicarb of 15.  Negative UA.  Negative COVID/flu PCR.  Chest x-ray negative.  Procalcitonin less than 0.10   He was started on IV insulin infusion and requested for transfer to [facility].  Currently patient denies any more nausea, vomiting or abdominal pain  TOC PLAN OF CARE: pt is elf sufficient lives alone, is normally complainant with care.  Plan is to return to home.  Expected Discharge Plan: Home/Self Care Barriers to Discharge: Continued Medical Work up   Patient Goals and CMS Choice Patient states their goals for this hospitalization and ongoing recovery are:: to go home CMS Medicare.gov Compare Post Acute Care list provided to:: Patient Choice offered to / list presented to : Patient  Expected Discharge Plan and Services Expected Discharge Plan: Home/Self Care   Discharge Planning Services: CM Consult   Living arrangements for the past 2 months: Apartment                                      Prior Living  Arrangements/Services Living arrangements for the past 2 months: Apartment Lives with:: Self Patient language and need for interpreter reviewed:: Yes Do you feel safe going back to the place where you live?: Yes            Criminal Activity/Legal Involvement Pertinent to Current Situation/Hospitalization: No - Comment as needed  Activities of Daily Living Home Assistive Devices/Equipment: None ADL Screening (condition at time of admission) Patient's cognitive ability adequate to safely complete daily activities?: Yes Is the patient deaf or have difficulty hearing?: No Does the patient have difficulty seeing, even when wearing glasses/contacts?: No Does the patient have difficulty concentrating, remembering, or making decisions?: No Patient able to express need for assistance with ADLs?: Yes Does the patient have difficulty dressing or bathing?: No Independently performs ADLs?: Yes (appropriate for developmental age) Does the patient have difficulty walking or climbing stairs?: No Weakness of Legs: None Weakness of Arms/Hands: None  Permission Sought/Granted                  Emotional Assessment Appearance:: Appears stated age     Orientation: : Oriented to Self, Oriented to Place, Oriented to  Time, Oriented to Situation Alcohol / Substance Use: Not Applicable Psych Involvement: No (comment)  Admission diagnosis:  Diabetic ketoacidosis (HCC) [E11.10] AKI (acute kidney injury) (HCC) [  N17.9] Diabetic ketoacidosis without coma associated with type 2 diabetes mellitus (HCC) [E11.10] Patient Active Problem List   Diagnosis Date Noted   AKI (acute kidney injury) (HCC) 09/25/2021   Elevated BP without diagnosis of hypertension 09/25/2021   BMI 50.0-59.9, adult (HCC) 09/25/2021   Diabetic ketoacidosis (HCC) 09/24/2021   PCP:  [name], MD (Inactive) Pharmacy:   [id number] - [place], [place] - [address] [place] [address] Phone: [phone] Fax: [phone]     Social Determinants of Health (SDOH) Interventions    Readmission Risk Interventions No flowsheet data found.

## 2021-09-25 NOTE — Progress Notes (Signed)
Inpatient Diabetes Program Recommendations  AACE/ADA: New Consensus Statement on Inpatient Glycemic Control (2015)  Target Ranges:  Prepandial:   less than 140 mg/dL      Peak postprandial:   less than 180 mg/dL (1-2 hours)      Critically ill patients:  140 - 180 mg/dL   Lab Results  Component Value Date   GLUCAP 310 (H) 09/25/2021   HGBA1C 11.4 (H) 09/25/2021    Review of Glycemic Control  Diabetes history: DM2 Outpatient Diabetes medications: metformin 500 mg BID Current orders for Inpatient glycemic control: IV insulin per [employer] for DKA HgbA1C - 11.4% Ready for transitioning to SQ insulin Pt is eating lunch.  Inpatient Diabetes Program Recommendations:    Semglee 25 units Q24H (kg x 0.15 Novolog 0-15 units Q4 x 12H then TID with meals and 0-5 HS Novolog 4 units TID for meal coverage insulin if eating > 50%.  Spoke with patient about new diabetes diagnosis.  Discussed A1C results (11.4%) and explained what an A1C is and informed patient that his current A1C indicates an average glucose of 280 mg/dl over the past 2-3 months. Discussed basic pathophysiology of DM Type 2, basic home care, importance of checking CBGs and maintaining good CBG control to prevent long-term and short-term complications. Reviewed glucose and A1C goals and explained that patient will need to continue to  Reviewed signs and symptoms of hyperglycemia and hypoglycemia along with treatment for both. Discussed impact of nutrition, exercise, stress, sickness, and medications on diabetes control. Reviewed Living Well with diabetes booklet and encouraged patient to read through entire book. IAsked patient to check his glucose 4 times per day (before meals and at bedtime) and to keep a log book of glucose readings and insulin taken. Explained how the doctor he follows up with can use the log book to continue to make insulin adjustments if needed.  Informed patient that RN will be asking him to self-administer insulin  to ensure proper technique and ability to administer self insulin shots.   Patient verbalized understanding of information discussed and he states that he has no further questions at this time related to diabetes.   RNs to provide ongoing basic DM education at bedside with this patient and engage patient to actively check blood glucose and administer insulin injections.   Will f/u in am.   Thank you. Ailene Ards, RD, LDN, CDE Inpatient Diabetes Coordinator [phone]

## 2021-09-25 NOTE — Progress Notes (Signed)
Pt seen and admitted by Dr Cyndia Bent earlier this am.  Please see note for detailed H&P.   Charles Petersen is a 20 y.o. male with medical history significant for morbid obesity, prediabetes on metformin who presents with nausea, vomiting abdominal pain.  He was found to in DKA and AKI. He was started on IV insulin with improvement in cbgs'/  Recommend checking BMP tonight and possible transition to [facility] with SSI.    Kathlen Mody, MD

## 2021-09-25 NOTE — Plan of Care (Signed)
Patient admitted to the unit and oriented to staff and healthcare goals

## 2021-09-25 NOTE — H&P (Signed)
History and Physical    Charles Petersen YKD:[id number] DOB: 02-27-2001 DOA: 09/24/2021  PCP: [name], MD (Inactive)  Patient [facility]  I have personally briefly reviewed patient's old medical records in [facility]  Chief Complaint: Nausea vomiting  HPI: Charles Petersen is a 20 y.o. male with medical history significant for morbid obesity, prediabetes on metformin who presents with nausea, vomiting abdominal pain.  Reports that he had 4 days of nausea, vomiting and diffuse abdominal pain.  Had nonbilious nonbloody emesis about twice a day.  Has been able to take fluids but not much food.  Notes diffuse abdominal pain worse with vomiting.  Denies any fever.  No previous abdominal surgeries.  No new foods or recent travels.  ED Course: He was afebrile, tachycardic and mildly hypertensive to systolic of 140s.  No leukocytosis, hemoglobin of 17.8.  Sodium 136, potassium of 5.1, creatinine of 1.61 from a prior of 1.21. BG of 605 with gap of 29, pH of 7.16 and bicarb of 15.  Negative UA.  Negative COVID/flu PCR.  Chest x-ray negative.  Procalcitonin less than 0.10  He was started on IV insulin infusion and requested for transfer to [facility].  Currently patient denies any more nausea, vomiting or abdominal pain  Review of Systems: Constitutional: No Weight Change, No Fever ENT/Mouth: No sore throat, No Rhinorrhea Eyes: N No Vision Changes Cardiovascular: No Chest Pain, no SOB Respiratory: No Cough Gastrointestinal: + Nausea, + Vomiting, No Diarrhea, No Constipation,+ Pain Genitourinary: no Urinary Incontinence,  Musculoskeletal: No Arthralgias, No Myalgias Skin: No Skin Lesions, No Pruritus, Neuro: no Weakness, No Numbness, Psych: No Anxiety/Panic, No Depression, + decrease appetite Heme/Lymph: No Bruising, No Bleeding  Past Medical History:  Diagnosis Date   Obesity     Past Surgical History:  Procedure Laterality Date   LEG SURGERY        reports that he has never smoked. He has never used smokeless tobacco. He reports that he does not drink alcohol and does not use drugs. Social History  No Known Allergies  Family history Mother-insulin-dependent type 2 diabetes Father-type 2 diabetes   Prior to Admission medications   Medication Sig Start Date End Date Taking? Authorizing Provider  metFORMIN (GLUCOPHAGE) 500 MG tablet Take 1 tablet (500 mg total) by mouth 2 (two) times daily with a meal for 60 doses. 09/17/21 10/17/21 Yes Mannie Stabile, PA-C    Physical Exam: Vitals:   09/24/21 2015 09/24/21 2313 09/25/21 0000 09/25/21 0100  BP: (!) 142/90 (!) 159/99 (!) 145/75 (!) 160/107  Pulse: (!) 109 (!) 110 98 (!) 106  Resp: 15 13 18 17   Temp:  98.1 F (36.7 C) 98.1 F (36.7 C)   TempSrc:  Oral Oral   SpO2: 100% 96% 96% 97%  Weight:      Height:        Constitutional: NAD, calm, comfortable, nontoxic appearing young male laying at approximately 30 degree incline in bed initially asleep Vitals:   09/24/21 2015 09/24/21 2313 09/25/21 0000 09/25/21 0100  BP: (!) 142/90 (!) 159/99 (!) 145/75 (!) 160/107  Pulse: (!) 109 (!) 110 98 (!) 106  Resp: 15 13 18 17   Temp:  98.1 F (36.7 C) 98.1 F (36.7 C)   TempSrc:  Oral Oral   SpO2: 100% 96% 96% 97%  Weight:      Height:       Eyes: lids and conjunctivae normal ENMT: Mucous membranes are moist.  Neck: normal,  supple Respiratory: clear to auscultation bilaterally, no wheezing, no crackles. Normal respiratory effort. No accessory muscle use.  Cardiovascular: Regular rate and rhythm, no murmurs / rubs / gallops. No extremity edema.  Abdomen: no tenderness, or guarding, no masses palpated.  Bowel sounds positive.  Musculoskeletal: no clubbing / cyanosis. No joint deformity upper and lower extremities. Good ROM, no contractures. Normal muscle tone.  Skin: no rashes, lesions, ulcers. No induration Neurologic: CN 2-12 grossly intact. Sensation intact, Strength  5/5 in all 4.  Psychiatric: Normal judgment and insight. Alert and oriented x 3.  Flat affect  Labs on Admission: I have personally reviewed following labs and imaging studies  CBC: Recent Labs  Lab 09/24/21 1745 09/24/21 1901  WBC 11.8*  --   NEUTROABS 9.7*  --   HGB 17.8* 17.7*  HCT 52.1* 52.0  MCV 89.7  --   PLT 373  --    Basic Metabolic Panel: Recent Labs  Lab 09/24/21 1745 09/24/21 1901 09/24/21 2315 09/25/21 0050  NA 136 139 144 144  K 5.1 5.2* 4.4 4.2  CL 97*  --  107 111  CO2 15*  --  13* 14*  GLUCOSE 605*  --  336* 223*  BUN 21*  --  20 18  CREATININE 1.61*  --  1.22 1.28*  CALCIUM 10.5*  --  10.5* 10.0   GFR: Estimated Creatinine Clearance: 142.1 mL/min (A) (by C-G formula based on SCr of 1.28 mg/dL (H)). Liver Function Tests: Recent Labs  Lab 09/24/21 1745  AST 19  ALT 38  ALKPHOS 114  BILITOT 1.6*  PROT 9.5*  ALBUMIN 4.9   Recent Labs  Lab 09/24/21 1745  LIPASE 35   No results for input(s): AMMONIA in the last 168 hours. Coagulation Profile: No results for input(s): INR, PROTIME in the last 168 hours. Cardiac Enzymes: No results for input(s): CKTOTAL, CKMB, CKMBINDEX, TROPONINI in the last 168 hours. BNP (last 3 results) No results for input(s): PROBNP in the last 8760 hours. HbA1C: No results for input(s): HGBA1C in the last 72 hours. CBG: Recent Labs  Lab 09/24/21 2102 09/24/21 2142 09/24/21 2246 09/24/21 2345 09/25/21 0053  GLUCAP 442* 401* 350* 261* 243*   Lipid Profile: No results for input(s): CHOL, HDL, LDLCALC, TRIG, CHOLHDL, LDLDIRECT in the last 72 hours. Thyroid Function Tests: No results for input(s): TSH, T4TOTAL, FREET4, T3FREE, THYROIDAB in the last 72 hours. Anemia Panel: No results for input(s): VITAMINB12, FOLATE, FERRITIN, TIBC, IRON, RETICCTPCT in the last 72 hours. Urine analysis:    Component Value Date/Time   COLORURINE STRAW (A) 09/24/2021 1745   APPEARANCEUR CLEAR 09/24/2021 1745   LABSPEC 1.030  09/24/2021 1745   PHURINE 6.0 09/24/2021 1745   GLUCOSEU >=500 (A) 09/24/2021 1745   HGBUR MODERATE (A) 09/24/2021 1745   BILIRUBINUR NEGATIVE 09/24/2021 1745   KETONESUR >=80 (A) 09/24/2021 1745   PROTEINUR 30 (A) 09/24/2021 1745   NITRITE NEGATIVE 09/24/2021 1745   LEUKOCYTESUR NEGATIVE 09/24/2021 1745    Radiological Exams on Admission: DG Chest 2 View  Result Date: 09/24/2021 CLINICAL DATA:  Nausea, vomiting, dizziness EXAM: CHEST - 2 VIEW COMPARISON:  None. FINDINGS: The cardiomediastinal silhouette is normal. There is no focal consolidation or pulmonary edema. There is no pleural effusion or pneumothorax. There is no acute osseous abnormality. IMPRESSION: No radiographic evidence of acute cardiopulmonary process. Electronically Signed   By: Lesia Hausen M.D.   On: 09/24/2021 20:39      Assessment/Plan  DKA - Has history of prediabetes  on metformin -- replete potassium as needed - continue insulin gtt with goal of 140-180 and AG <12 - IV NS until BG <250, then switch to D5 1/2 NS  - BMP q4hr  - keep NPO   AKI - Creatinine elevated to 1.61 - Follow with repeat creatinine after IV fluid hydration.  Avoid nephrotoxic agent  Elevated blood pressure - Suspect probable undiagnosed hypertension.  Continue to monitor closely in likely will need to start oral CCB or thiazide  Morbid obesity - BMI of 51  DVT prophylaxis:.Lovenox Code Status: Full Family Communication: Plan discussed with patient at bedside  disposition Plan: Home with observation Consults called:  Admission status: Observation Level of care: Stepdown  Status is: Observation  The patient remains OBS appropriate and will d/c before 2 midnights.  Dispo: The patient is from: Home              Anticipated d/c is to: Home              Patient currently is not medically stable to d/c.   Difficult to place patient No         Anselm Jungling DO [facility]   If 7PM-7AM, please contact  night-coverage [email or web address]   09/25/2021, 1:34 AM

## 2021-09-26 DIAGNOSIS — N179 Acute kidney failure, unspecified: Secondary | ICD-10-CM | POA: Diagnosis not present

## 2021-09-26 DIAGNOSIS — Z6841 Body Mass Index (BMI) 40.0 and over, adult: Secondary | ICD-10-CM | POA: Diagnosis not present

## 2021-09-26 DIAGNOSIS — E1110 Type 2 diabetes mellitus with ketoacidosis without coma: Secondary | ICD-10-CM | POA: Diagnosis not present

## 2021-09-26 LAB — GLUCOSE, CAPILLARY
Glucose-Capillary: 133 mg/dL — ABNORMAL HIGH (ref 70–99)
Glucose-Capillary: 140 mg/dL — ABNORMAL HIGH (ref 70–99)
Glucose-Capillary: 151 mg/dL — ABNORMAL HIGH (ref 70–99)
Glucose-Capillary: 156 mg/dL — ABNORMAL HIGH (ref 70–99)
Glucose-Capillary: 158 mg/dL — ABNORMAL HIGH (ref 70–99)
Glucose-Capillary: 162 mg/dL — ABNORMAL HIGH (ref 70–99)
Glucose-Capillary: 203 mg/dL — ABNORMAL HIGH (ref 70–99)
Glucose-Capillary: 205 mg/dL — ABNORMAL HIGH (ref 70–99)
Glucose-Capillary: 213 mg/dL — ABNORMAL HIGH (ref 70–99)
Glucose-Capillary: 150 mg/dL — ABNORMAL HIGH (ref 70–99)
Glucose-Capillary: 474 mg/dL — ABNORMAL HIGH (ref 70–99)
Glucose-Capillary: 439 mg/dL — ABNORMAL HIGH (ref 70–99)
Glucose-Capillary: 169 mg/dL — ABNORMAL HIGH (ref 70–99)
Glucose-Capillary: 194 mg/dL — ABNORMAL HIGH (ref 70–99)
Glucose-Capillary: 361 mg/dL — ABNORMAL HIGH (ref 70–99)

## 2021-09-26 LAB — CBC
HCT: 40.5 % (ref 39.0–52.0)
Hemoglobin: 14.5 g/dL (ref 13.0–17.0)
MCH: 31.5 pg (ref 26.0–34.0)
MCHC: 35.8 g/dL (ref 30.0–36.0)
MCV: 87.9 fL (ref 80.0–100.0)
Platelets: 186 10*3/uL (ref 150–400)
RBC: 4.61 MIL/uL (ref 4.22–5.81)
RDW: 12.6 % (ref 11.5–15.5)
WBC: 8.5 10*3/uL (ref 4.0–10.5)
nRBC: 0 % (ref 0.0–0.2)

## 2021-09-26 MED ORDER — INSULIN GLARGINE-YFGN 100 UNIT/ML ~~LOC~~ SOLN
20.0000 [IU] | Freq: Two times a day (BID) | SUBCUTANEOUS | Status: DC
  Administered 2021-09-26: 20 [IU] via SUBCUTANEOUS
  Filled 2021-09-26 (×2): qty 0.2

## 2021-09-26 MED ORDER — INSULIN ASPART 100 UNIT/ML IJ SOLN
5.0000 [IU] | Freq: Three times a day (TID) | INTRAMUSCULAR | Status: DC
  Administered 2021-09-26: 5 [IU] via SUBCUTANEOUS

## 2021-09-26 MED ORDER — DIPHENHYDRAMINE-ZINC ACETATE 2-0.1 % EX CREA: TOPICAL_CREAM | Freq: Two times a day (BID) | CUTANEOUS | Status: DC | PRN

## 2021-09-26 MED ORDER — INSULIN ASPART 100 UNIT/ML IJ SOLN
0.0000 [IU] | Freq: Three times a day (TID) | INTRAMUSCULAR | Status: DC
  Administered 2021-09-26: 2 [IU] via SUBCUTANEOUS
  Administered 2021-09-26 – 2021-09-27 (×4): 15 [IU] via SUBCUTANEOUS
  Administered 2021-09-28: 8 [IU] via SUBCUTANEOUS

## 2021-09-26 MED ORDER — AMLODIPINE BESYLATE 5 MG PO TABS
5.0000 mg | ORAL_TABLET | Freq: Every day | ORAL | Status: DC
  Administered 2021-09-26 – 2021-09-28 (×3): 5 mg via ORAL
  Filled 2021-09-26 (×3): qty 1

## 2021-09-26 MED ORDER — INSULIN ASPART 100 UNIT/ML IJ SOLN
3.0000 [IU] | Freq: Three times a day (TID) | INTRAMUSCULAR | Status: DC
  Administered 2021-09-26: 3 [IU] via SUBCUTANEOUS

## 2021-09-26 MED ORDER — POTASSIUM CHLORIDE CRYS ER 20 MEQ PO TBCR
40.0000 meq | EXTENDED_RELEASE_TABLET | Freq: Once | ORAL | Status: AC
  Administered 2021-09-26: 40 meq via ORAL
  Filled 2021-09-26: qty 2

## 2021-09-26 MED ORDER — INSULIN ASPART 100 UNIT/ML IJ SOLN
0.0000 [IU] | Freq: Every day | INTRAMUSCULAR | Status: DC
  Administered 2021-09-26 – 2021-09-27 (×2): 5 [IU] via SUBCUTANEOUS

## 2021-09-26 MED ORDER — BISACODYL 5 MG PO TBEC
5.0000 mg | DELAYED_RELEASE_TABLET | Freq: Every day | ORAL | Status: DC | PRN
  Administered 2021-09-27: 5 mg via ORAL
  Filled 2021-09-26: qty 1

## 2021-09-26 MED ORDER — INSULIN GLARGINE-YFGN 100 UNIT/ML ~~LOC~~ SOLN
25.0000 [IU] | Freq: Every day | SUBCUTANEOUS | Status: DC
  Administered 2021-09-26: 25 [IU] via SUBCUTANEOUS
  Filled 2021-09-26: qty 0.25

## 2021-09-26 MED ORDER — INSULIN ASPART 100 UNIT/ML IJ SOLN
5.0000 [IU] | Freq: Once | INTRAMUSCULAR | Status: AC
  Administered 2021-09-26: 5 [IU] via SUBCUTANEOUS

## 2021-09-26 MED ORDER — INFLUENZA VAC SPLIT QUAD 0.5 ML IM SUSY
0.5000 mL | PREFILLED_SYRINGE | INTRAMUSCULAR | Status: AC
  Administered 2021-09-27: 0.5 mL via INTRAMUSCULAR
  Filled 2021-09-26: qty 0.5

## 2021-09-26 MED ORDER — INSULIN ASPART 100 UNIT/ML IJ SOLN: 5.0000 [IU] | Freq: Three times a day (TID) | INTRAMUSCULAR | Status: DC

## 2021-09-26 NOTE — Progress Notes (Signed)
Pt CBG 474. MD Gherghe notified via page. Pt given a total of 20 units of novoLOG (15 units of sliding scale, and 5 units of scheduled novoLOG); verified with second RN Sunjata.

## 2021-09-26 NOTE — Progress Notes (Signed)
PROGRESS NOTE  Charles Petersen ZMO:[id number] DOB: 05-Feb-2001 DOA: 09/24/2021 PCP: [name], MD (Inactive)   LOS: 1 day   Brief Narrative / Interim history: 20 year old male with history of obesity, prediabetes on metformin who came with nausea, vomiting, abdominal pain.  This is been going on for the past 4 days, he was found to be in DKA, placed on insulin infusion and admitted to stepdown  Subjective / 24h Interval events: Is doing well this morning, no complaints.  No more abdominal pain, no nausea or vomiting.  Assessment & Plan: Principal Problem DKA, poorly controlled diabetes mellitus with hyperglycemia- A1c was found to be 11.4.  He is ready to come off insulin infusion this morning, placed on glargine 25 units daily as well as aspart 3 units 3 times daily with meals plus moderate sliding scale.  Appreciate diabetes coordinator education.  Will monitor CBGs today and determine home insulin needs anticipate discharge home tomorrow  Active Problems Essential hypertension-persistently elevated blood pressure, start amlodipine  Acute kidney injury-due to #1, dehydration.  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  Scheduled Meds:  amLODipine  5 mg Oral Daily   Chlorhexidine Gluconate Cloth  6 each Topical Daily   enoxaparin (LOVENOX) injection  80 mg Subcutaneous Q24H   insulin aspart  0-15 Units Subcutaneous TID WC   insulin aspart  0-5 Units Subcutaneous QHS   insulin aspart  3 Units Subcutaneous TID WC   insulin glargine-yfgn  25 Units Subcutaneous Daily   Continuous Infusions: PRN Meds:.dextrose, ondansetron (ZOFRAN) IV  Diet Orders (From admission, onward)     Start     Ordered   09/25/21 1040  Diet Carb Modified Fluid consistency: Thin; Room service appropriate? Yes  Diet effective now       Question Answer Comment  Diet-HS Snack? Nothing   Calorie Level Medium 1600-2000   Fluid consistency: Thin   Room service appropriate? Yes      09/25/21 1039             DVT prophylaxis:      Code Status: Full Code  Family Communication: no family at bedside   Status is: Inpatient  Remains inpatient appropriate because:IV treatments appropriate due to intensity of illness or inability to take PO  Dispo: The patient is from: Home              Anticipated d/c is to: Home              Patient currently is not medically stable to d/c.   Difficult to place patient No  Level of care: Med-Surg  Consultants:  None   Procedures:  None   Microbiology  None   Antimicrobials: None     Objective: Vitals:   09/26/21 0743 09/26/21 0800 09/26/21 0930 09/26/21 1000  BP:   (!) 175/114 (!) 150/93  Pulse:      Resp:  13 16 16   Temp: 97.8 F (36.6 C)     TempSrc: Oral     SpO2:      Weight:      Height:        Intake/Output Summary (Last 24 hours) at 09/26/2021 1116 Last data filed at 09/26/2021 0815 Gross per 24 hour  Intake 3284.25 ml  Output --  Net 3284.25 ml   Filed Weights   09/24/21 1641  Weight: (!) 163.3 kg    Examination:  Constitutional: NAD Eyes: no scleral icterus ENMT: Mucous membranes are moist.  Neck: normal, supple Respiratory: clear  to auscultation bilaterally, no wheezing, no crackles. Normal respiratory effort. No accessory muscle use.  Cardiovascular: Regular rate and rhythm, no murmurs / rubs / gallops. No LE edema. Good peripheral pulses Abdomen: non distended, no tenderness. Bowel sounds positive.  Musculoskeletal: no clubbing / cyanosis.  Skin: no rashes Neurologic: CN 2-12 grossly intact. Strength 5/5 in all 4.   Data Reviewed: I have independently reviewed following labs and imaging studies   CBC: Recent Labs  Lab 09/24/21 1745 09/24/21 1901 09/26/21 0745  WBC 11.8*  --  8.5  NEUTROABS 9.7*  --   --   HGB 17.8* 17.7* 14.5  HCT 52.1* 52.0 40.5  MCV 89.7  --  87.9  PLT 373  --  186   Basic Metabolic Panel: Recent Labs  Lab 09/24/21 2315 09/25/21 0050 09/25/21 0335  09/25/21 1455 09/25/21 1949  NA 144 144 145 137 137  K 4.4 4.2 4.1 4.1 3.3*  CL 107 111 113* 105 107  CO2 13* 14* 15* 19* 21*  GLUCOSE 336* 223* 208* 313* 350*  BUN 20 18 19 15 15   CREATININE 1.22 1.28* 1.17 1.06 0.90  CALCIUM 10.5* 10.0 9.9 9.3 9.2   Liver Function Tests: Recent Labs  Lab 09/24/21 1745  AST 19  ALT 38  ALKPHOS 114  BILITOT 1.6*  PROT 9.5*  ALBUMIN 4.9   Coagulation Profile: No results for input(s): INR, PROTIME in the last 168 hours. HbA1C: Recent Labs    09/25/21 0230  HGBA1C 11.4*   CBG: Recent Labs  Lab 09/26/21 0729 09/26/21 0812 09/26/21 0925 09/26/21 1012 09/26/21 1114  GLUCAP 203* 156* 162* 158* 150*    Recent Results (from the past 240 hour(s))  Resp Panel by RT-PCR (Flu A&B, Covid) Nasopharyngeal Swab     Status: None   Collection Time: 09/24/21  9:25 PM   Specimen: Nasopharyngeal Swab; Nasopharyngeal(NP) swabs in vial transport medium  Result Value Ref Range Status   SARS Coronavirus 2 by RT PCR NEGATIVE NEGATIVE Final    Comment: (NOTE) SARS-CoV-2 target nucleic acids are NOT DETECTED.  The SARS-CoV-2 RNA is generally detectable in upper respiratory specimens during the acute phase of infection. The lowest concentration of SARS-CoV-2 viral copies this assay can detect is 138 copies/mL. A negative result does not preclude SARS-Cov-2 infection and should not be used as the sole basis for treatment or other patient management decisions. A negative result may occur with  improper specimen collection/handling, submission of specimen other than nasopharyngeal swab, presence of viral mutation(s) within the areas targeted by this assay, and inadequate number of viral copies(<138 copies/mL). A negative result must be combined with clinical observations, patient history, and epidemiological information. The expected result is Negative.  Fact Sheet for Patients:  09/26/21  Fact Sheet for Healthcare  Providers:  BloggerCourse.com  This test is no t yet approved or cleared by the [place] FDA and  has been authorized for detection and/or diagnosis of SARS-CoV-2 by FDA under an Emergency Use Authorization (EUA). This EUA will remain  in effect (meaning this test can be used) for the duration of the COVID-19 declaration under Section 564(b)(1) of the Act, 21 U.S.C.section 360bbb-3(b)(1), unless the authorization is terminated  or revoked sooner.       Influenza A by PCR NEGATIVE NEGATIVE Final   Influenza B by PCR NEGATIVE NEGATIVE Final    Comment: (NOTE) The Xpert Xpress SARS-CoV-2/FLU/RSV plus assay is intended as an aid in the diagnosis of influenza from Nasopharyngeal swab specimens and  should not be used as a sole basis for treatment. Nasal washings and aspirates are unacceptable for Xpert Xpress SARS-CoV-2/FLU/RSV testing.  Fact Sheet for Patients: BloggerCourse.com  Fact Sheet for Healthcare Providers: SeriousBroker.it  This test is not yet approved or cleared by the [place] FDA and has been authorized for detection and/or diagnosis of SARS-CoV-2 by FDA under an Emergency Use Authorization (EUA). This EUA will remain in effect (meaning this test can be used) for the duration of the COVID-19 declaration under Section 564(b)(1) of the Act, 21 U.S.C. section 360bbb-3(b)(1), unless the authorization is terminated or revoked.  Performed at [facility], [address] [place], [place] [address]   MRSA Next Gen by PCR, Nasal     Status: None   Collection Time: 09/24/21 10:32 PM   Specimen: Nasal Mucosa; Nasal Swab  Result Value Ref Range Status   MRSA by PCR Next Gen NOT DETECTED NOT DETECTED Final    Comment: (NOTE) The GeneXpert MRSA Assay (FDA approved for NASAL specimens only), is one component of a comprehensive MRSA colonization surveillance program. It is not  intended to diagnose MRSA infection nor to guide or monitor treatment for MRSA infections. Test performance is not FDA approved in patients less than 87 years old. Performed at [facility], [address] [place], [place] [address]   Blood culture (routine x 2)     Status: None (Preliminary result)   Collection Time: 09/24/21 11:15 PM   Specimen: BLOOD LEFT WRIST  Result Value Ref Range Status   Specimen Description   Final    BLOOD LEFT WRIST Performed at [facility], [address] [place], [place] [address]    Special Requests   Final    BOTTLES DRAWN AEROBIC ONLY Blood Culture adequate volume Performed at [facility], [address], [place], [place] [address]    Culture   Final    NO GROWTH 1 DAY Performed at [facility], [address] [place], [place] [address]    Report Status PENDING  Incomplete  Blood culture (routine x 2)     Status: None (Preliminary result)   Collection Time: 09/24/21 11:15 PM   Specimen: BLOOD LEFT HAND  Result Value Ref Range Status   Specimen Description   Final    BLOOD LEFT HAND Performed at [facility], [address], [place], [place] [address]    Special Requests   Final    BOTTLES DRAWN AEROBIC ONLY Blood Culture adequate volume Performed at [facility], [address], [place], [place] [address]    Culture   Final    NO GROWTH 1 DAY Performed at [facility], [address], [place], [place] [address]    Report Status PENDING  Incomplete     Radiology Studies: No results found.   Pamella Pert, MD, PhD [facility]  Between 7 am - 7 pm I am available, please contact me via Amion (for emergencies) or Securechat (non urgent messages)  Between 7 pm - 7 am I am not available, please contact night coverage MD/APP via Amion

## 2021-09-26 NOTE — Progress Notes (Addendum)
Pt CBG 439. MD Gherghe notified of CBG.

## 2021-09-26 NOTE — Progress Notes (Signed)
NUTRITION EDUCATION NOTE  RD consulted for nutrition education regarding diabetes.   Lab Results  Component Value Date   HGBA1C 11.4 (H) 09/25/2021    RD provided "Carbohydrate Counting for People with Diabetes" handout from the Academy of Nutrition and Dietetics. Discussed different food groups and their effects on blood sugar, emphasizing carbohydrate-containing foods. Provided list of carbohydrates and recommended serving sizes of common foods.  Discussed importance of controlled and consistent carbohydrate intake throughout the day. Provided examples of ways to balance meals/snacks and encouraged intake of high-fiber, whole grain complex carbohydrates. Teach back method used.  Expect good compliance.  Patient was seen by DM Coordinator yesterday. Patient reports they briefly discussed food as it relates to blood sugar, mainly decreasing portion sizes and beverages.   Reviewed handouts in detail with patient. He denies any questions or concerns at this time. Encouraged him to let RN know if he has questions prior to d/c and that RD is happy to stop back.   Body mass index is 51.65 kg/m. Pt meets criteria for morbid obesity based on current BMI.  Current diet order is Carb Modified, patient is consuming approximately 100% of meals at this time. Labs and medications reviewed. No further nutrition interventions warranted at this time. RD contact information provided. If additional nutrition issues arise, please re-consult RD.      Trenton Gammon, MS, RD, LDN, CNSC Inpatient Clinical Dietitian RD pager # available in AMION  After hours/weekend pager # available in [facility]

## 2021-09-26 NOTE — Progress Notes (Signed)
Pt notified of transfer to [place]. Nurse called report. Pt's belongings collected. Pt brought up to [place] in wheelchair. 5E RN met [place] in the room. Pt placed in bed & stable.

## 2021-09-27 DIAGNOSIS — Z6841 Body Mass Index (BMI) 40.0 and over, adult: Secondary | ICD-10-CM | POA: Diagnosis not present

## 2021-09-27 DIAGNOSIS — N179 Acute kidney failure, unspecified: Secondary | ICD-10-CM | POA: Diagnosis not present

## 2021-09-27 DIAGNOSIS — E1110 Type 2 diabetes mellitus with ketoacidosis without coma: Secondary | ICD-10-CM | POA: Diagnosis not present

## 2021-09-27 LAB — GLUCOSE, CAPILLARY
Glucose-Capillary: 351 mg/dL — ABNORMAL HIGH (ref 70–99)
Glucose-Capillary: 367 mg/dL — ABNORMAL HIGH (ref 70–99)
Glucose-Capillary: 355 mg/dL — ABNORMAL HIGH (ref 70–99)
Glucose-Capillary: 429 mg/dL — ABNORMAL HIGH (ref 70–99)

## 2021-09-27 LAB — BASIC METABOLIC PANEL
Anion gap: 12 (ref 5–15)
BUN: 13 mg/dL (ref 6–20)
CO2: 20 mmol/L — ABNORMAL LOW (ref 22–32)
Calcium: 8.6 mg/dL — ABNORMAL LOW (ref 8.9–10.3)
Chloride: 104 mmol/L (ref 98–111)
Creatinine, Ser: 0.94 mg/dL (ref 0.61–1.24)
GFR, Estimated: >60 mL/min (ref >60)
Glucose, Bld: 332 mg/dL — ABNORMAL HIGH (ref 70–99)
Potassium: 3.5 mmol/L (ref 3.5–5.1)
Sodium: 136 mmol/L (ref 135–145)

## 2021-09-27 MED ORDER — INSULIN ASPART 100 UNIT/ML IJ SOLN
2.0000 [IU] | Freq: Once | INTRAMUSCULAR | Status: AC
  Administered 2021-09-27: 2 [IU] via SUBCUTANEOUS

## 2021-09-27 MED ORDER — INSULIN GLARGINE-YFGN 100 UNIT/ML ~~LOC~~ SOLN
10.0000 [IU] | Freq: Once | SUBCUTANEOUS | Status: AC
  Administered 2021-09-27: 10 [IU] via SUBCUTANEOUS
  Filled 2021-09-27: qty 0.1

## 2021-09-27 MED ORDER — INSULIN GLARGINE-YFGN 100 UNIT/ML ~~LOC~~ SOLN
40.0000 [IU] | Freq: Every day | SUBCUTANEOUS | Status: DC
  Administered 2021-09-27 – 2021-09-28 (×2): 40 [IU] via SUBCUTANEOUS
  Filled 2021-09-27 (×2): qty 0.4

## 2021-09-27 MED ORDER — INSULIN ASPART 100 UNIT/ML IJ SOLN
14.0000 [IU] | Freq: Three times a day (TID) | INTRAMUSCULAR | Status: DC
  Administered 2021-09-27 – 2021-09-28 (×2): 14 [IU] via SUBCUTANEOUS

## 2021-09-27 MED ORDER — INSULIN ASPART 100 UNIT/ML IJ SOLN
10.0000 [IU] | Freq: Three times a day (TID) | INTRAMUSCULAR | Status: DC
  Administered 2021-09-27 (×2): 10 [IU] via SUBCUTANEOUS

## 2021-09-27 NOTE — Progress Notes (Signed)
Pt awake most of the night. Denies pain, pt looking forward to going home. Pt noted with reported LBM a week ago. James Daniel-NP notified, dulcolax 5mg  po given per order. Awaiting results.

## 2021-09-27 NOTE — Progress Notes (Signed)
Inpatient Diabetes Program Recommendations  AACE/ADA: New Consensus Statement on Inpatient Glycemic Control (2015)  Target Ranges:  Prepandial:   less than 140 mg/dL      Peak postprandial:   less than 180 mg/dL (1-2 hours)      Critically ill patients:  140 - 180 mg/dL   Lab Results  Component Value Date   GLUCAP 351 (H) 09/27/2021   HGBA1C 11.4 (H) 09/25/2021    Review of Glycemic Control  Diabetes history: New[place]onset Outpatient Diabetes medications: None Current orders for Inpatient glycemic control: Lantus 40 units QD, Novolog 0-  Inpatient Diabetes Program Recommendations:    Increase Novolog to 12 units TID with meals if eating > 50%.  TOC consult to check copay for Lantus/levemir and Novolog/humalog.  Agree with Lantus 40 QD  Pt appears to be very resistant to insulin.   Thank you. Ailene Ards, RD, LDN, CDE Inpatient Diabetes Coordinator [phone]

## 2021-09-28 DIAGNOSIS — N179 Acute kidney failure, unspecified: Secondary | ICD-10-CM | POA: Diagnosis not present

## 2021-09-28 DIAGNOSIS — Z6841 Body Mass Index (BMI) 40.0 and over, adult: Secondary | ICD-10-CM | POA: Diagnosis not present

## 2021-09-28 DIAGNOSIS — E1110 Type 2 diabetes mellitus with ketoacidosis without coma: Secondary | ICD-10-CM | POA: Diagnosis not present

## 2021-09-28 LAB — GLUCOSE, CAPILLARY: Glucose-Capillary: 280 mg/dL — ABNORMAL HIGH (ref 70–99)

## 2021-09-28 MED ORDER — BLOOD GLUCOSE MONITOR KIT: PACK | 0 refills | Status: AC

## 2021-09-28 MED ORDER — INSULIN ASPART 100 UNIT/ML FLEXPEN: 0.0000 [IU] | PEN_INJECTOR | Freq: Three times a day (TID) | SUBCUTANEOUS | 11 refills | Status: AC

## 2021-09-28 MED ORDER — FREESTYLE LIBRE 2 SENSOR MISC: 1.0000 | 1 refills | Status: AC

## 2021-09-28 MED ORDER — INSULIN GLARGINE 100 UNIT/ML SOLOSTAR PEN: 40.0000 [IU] | PEN_INJECTOR | Freq: Every day | SUBCUTANEOUS | 1 refills | Status: AC

## 2021-09-28 MED ORDER — "PEN NEEDLES 5/16"" 31G X 8 MM MISC": 1.0000 | Freq: Four times a day (QID) | 1 refills | Status: AC

## 2021-09-28 MED ORDER — AMLODIPINE BESYLATE 5 MG PO TABS: 5.0000 mg | ORAL_TABLET | Freq: Every day | ORAL | 0 refills | Status: AC

## 2021-09-28 MED ORDER — FREESTYLE LIBRE 2 READER DEVI: 1.0000 | Freq: Every day | 0 refills | Status: AC

## 2021-09-28 NOTE — Discharge Instructions (Addendum)
Please use 40 Units of Lantus daily  Please check your CBG before each meal and administer Novolog in accordance with the table below  0-20 Units, Subcutaneous, 3 times daily with meals CBG 70 - 120: 0 units CBG 121 - 150: 3 units CBG 151 - 200: 4 units CBG 201 - 250: 7 units CBG 251 - 300: 11 units CBG 301 - 350: 14 units CBG 351 - 400: 18 units  Please note that your insulin requirements may change over time and based on what you eat. Please create a log with your CBGs, check them 4 times daily, before each meal and before dinner, record all the values and how much insulin you have administered yourself, and follow up with your PCP no later than 5-7 days from discharge !!!   Please get a complete blood count and chemistry panel checked by your Primary MD at your next visit, and again as instructed by your Primary MD. Please get your medications reviewed and adjusted by your Primary MD.  Please request your Primary MD to go over all Hospital Tests and Procedure/Radiological results at the follow up, please get all Hospital records sent to your Prim MD by signing hospital release before you go home.  In some cases, there will be blood work, cultures and biopsy results pending at the time of your discharge. Please request that your primary care M.D. goes through all the records of your hospital data and follows up on these results.  If you had Pneumonia of Lung problems at the Hospital: Please get a 2 view Chest X ray done in 6-8 weeks after hospital discharge or sooner if instructed by your Primary MD.  If you have Congestive Heart Failure: Please call your Cardiologist or Primary MD anytime you have any of the following symptoms:  1) 3 pound weight gain in 24 hours or 5 pounds in 1 week  2) shortness of breath, with or without a dry hacking cough  3) swelling in the hands, feet or stomach  4) if you have to sleep on extra pillows at night in order to breathe  Follow cardiac low salt  diet and 1.5 lit/day fluid restriction.  If you have diabetes Accuchecks 4 times/day, Once in AM empty stomach and then before each meal. Log in all results and show them to your primary doctor at your next visit. If any glucose reading is under 80 or above 300 call your primary MD immediately.  If you have Seizure/Convulsions/Epilepsy: Please do not drive, operate heavy machinery, participate in activities at heights or participate in high speed sports until you have seen by Primary MD or a Neurologist and advised to do so again. Per [facility] statutes, patients with seizures are not allowed to drive until they have been seizure-free for six months.  Use caution when using heavy equipment or power tools. Avoid working on ladders or at heights. Take showers instead of baths. Ensure the water temperature is not too high on the home water heater. Do not go swimming alone. Do not lock yourself in a room alone (i.e. bathroom). When caring for infants or small children, sit down when holding, feeding, or changing them to minimize risk of injury to the child in the event you have a seizure. Maintain good sleep hygiene. Avoid alcohol.   If you had Gastrointestinal Bleeding: Please ask your Primary MD to check a complete blood count within one week of discharge or at your next visit. Your endoscopic/colonoscopic biopsies that  are pending at the time of discharge, will also need to followed by your Primary MD.  Get Medicines reviewed and adjusted. Please take all your medications with you for your next visit with your Primary MD  Please request your Primary MD to go over all hospital tests and procedure/radiological results at the follow up, please ask your Primary MD to get all Hospital records sent to his/her office.  If you experience worsening of your admission symptoms, develop shortness of breath, life threatening emergency, suicidal or homicidal thoughts you must seek medical attention  immediately by calling 911 or calling your MD immediately  if symptoms less severe.  You must read complete instructions/literature along with all the possible adverse reactions/side effects for all the Medicines you take and that have been prescribed to you. Take any new Medicines after you have completely understood and accpet all the possible adverse reactions/side effects.   Do not drive or operate heavy machinery when taking Pain medications.   Do not take more than prescribed Pain, Sleep and Anxiety Medications  Special Instructions: If you have smoked or chewed Tobacco  in the last 2 yrs please stop smoking, stop any regular Alcohol  and or any Recreational drug use.  Wear Seat belts while driving.  Please note You were cared for by a hospitalist during your hospital stay. If you have any questions about your discharge medications or the care you received while you were in the hospital after you are discharged, you can call the unit and asked to speak with the hospitalist on call if the hospitalist that took care of you is not available. Once you are discharged, your primary care physician will handle any further medical issues. Please note that NO REFILLS for any discharge medications will be authorized once you are discharged, as it is imperative that you return to your primary care physician (or establish a relationship with a primary care physician if you do not have one) for your aftercare needs so that they can reassess your need for medications and monitor your lab values.  You can reach the hospitalist office at phone [phone] or fax [phone]   If you do not have a primary care physician, you can call [phone] for a physician referral.  Activity: As tolerated with Full fall precautions use walker/cane & assistance as needed    Diet: diabetic diet  Disposition Home

## 2021-09-28 NOTE — Progress Notes (Signed)
Pt discharged home. AVS printed and educational teaching completed with teach back method. Diabetic teaching completed with this RN. Pt given printed educational teaching on diabetes. Ask me 3 utilized. PIV removed. VSS. Pt has all belongings. Pt given printed prescriptions. No further questions at this time.

## 2021-09-28 NOTE — Discharge Summary (Signed)
Physician Discharge Summary  Charles Petersen TDS:[id number] DOB: March 12, 2001 DOA: 2/62/0355  PCP: Charles Gambles, MD (Inactive)  Admit date: 09/24/2021 Discharge date: 09/28/2021  Admitted From: home Disposition:  home  Recommendations for Outpatient Follow-up:  Follow up with PCP in 5-7 days  Home Health: none Equipment/Devices: none  Discharge Condition: stable CODE STATUS: Full code Diet recommendation: diabetic  HPI: Per admitting MD, Charles Petersen is a 20 y.o. male with medical history significant for morbid obesity, prediabetes on metformin who presents with nausea, vomiting abdominal pain. Reports that he had 4 days of nausea, vomiting and diffuse abdominal pain.  Had nonbilious nonbloody emesis about twice a day.  Has been able to take fluids but not much food.  Notes diffuse abdominal pain worse with vomiting.  Denies any fever.  No previous abdominal surgeries.  No new foods or recent travels.  Hospital Course / Discharge diagnoses: Principal Problem DKA, poorly controlled diabetes mellitus with hyperglycemia- A1c was found to be 11.4. Patient was admitted to the hospital in DKA, was initially on an insulin drip. His DKA corrected and was transitioned to s.q. insulin. CBGs were somewhat difficult to control due to likely significant insulin resistance but eventually were down into the 200s. He is back to baseline, no further nausea/vomiting, received extensive counseling about his diabetes and will be discharged home in stable continue on Lantus and Novlog sliding scale   Active Problems Essential hypertension-persistently elevated blood pressure, started on and continue amlodipine Acute kidney injury-due to #1, dehydration.  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   blood glucose meter kit and supplies Kit Dispense based on patient and insurance preference. Use up to four times daily as directed.   FreeStyle Libre 2 Reader [place] 1 each by Does not apply route daily.   FreeStyle Libre 2 Sensor Misc 1 each by Does not apply route every 14 (fourteen) days.   insulin aspart 100 UNIT/ML FlexPen Commonly known as: NOVOLOG Inject 0-20 Units into the skin 3 (three) times daily with meals. 0-20 Units, Subcutaneous, 3 times daily with meals CBG 70 - 120: 0 units CBG 121 - 150: 3 units CBG 151 - 200: 4 units CBG 201 - 250: 7 units CBG 251 - 300: 11 units CBG 301 - 350: 14 units CBG 351 - 400: 18 units   insulin glargine 100 UNIT/ML Solostar Pen Commonly known as: LANTUS Inject 40 Units into the skin daily. Dispense vial if insurance does not cover pen   metFORMIN 500 MG tablet Commonly known as: GLUCOPHAGE Take 1 tablet (500 mg total) by mouth 2 (two) times daily with a meal for 60 doses.   PEN NEEDLES 31GX5/16" 31G X 8 MM Misc 1 each by Does not apply route 4 (four) times daily.         Consultations: None   Procedures/Studies: None   DG Chest 2 View  Result Date: 09/24/2021 CLINICAL DATA:  Nausea, vomiting, dizziness EXAM: CHEST - 2 VIEW COMPARISON:  None. FINDINGS: The cardiomediastinal silhouette is normal. There is no focal consolidation or pulmonary edema. There is no pleural effusion or pneumothorax. There is no acute osseous abnormality. IMPRESSION: No  radiographic evidence of acute cardiopulmonary process. Electronically Signed   By: Valetta Mole M.D.   On: 09/24/2021 20:39     Subjective: - no chest pain, shortness of breath, no abdominal pain, nausea or vomiting.   Discharge Exam: BP (!) 157/91 (BP Location: Left Arm)   Pulse 94   Temp 98.1 F (36.7 C) (Oral)   Resp 20   Ht _0  (1.778 m)   Wt (!) 163.3 kg   SpO2 100%   BMI 51.65  kg/m   General: Pt is alert, awake, not in acute distress Cardiovascular: RRR, S1/S2 +, no rubs, no gallops Respiratory: CTA bilaterally, no wheezing, no rhonchi Abdominal: Soft, NT, ND, bowel sounds + Extremities: no edema, no cyanosis    The results of significant diagnostics from this hospitalization (including imaging, microbiology, ancillary and laboratory) are listed below for reference.     Microbiology: Recent Results (from the past 240 hour(s))  Resp Panel by RT-PCR (Flu A&B, Covid) Nasopharyngeal Swab     Status: None   Collection Time: 09/24/21  9:25 PM   Specimen: Nasopharyngeal Swab; Nasopharyngeal(NP) swabs in vial transport medium  Result Value Ref Range Status   SARS Coronavirus 2 by RT PCR NEGATIVE NEGATIVE Final    Comment: (NOTE) SARS-CoV-2 target nucleic acids are NOT DETECTED.  The SARS-CoV-2 RNA is generally detectable in upper respiratory specimens during the acute phase of infection. The lowest concentration of SARS-CoV-2 viral copies this assay can detect is 138 copies/mL. A negative result does not preclude SARS-Cov-2 infection and should not be used as the sole basis for treatment or other patient management decisions. A negative result may occur with  improper specimen collection/handling, submission of specimen other than nasopharyngeal swab, presence of viral mutation(s) within the areas targeted by this assay, and inadequate number of viral copies(<138 copies/mL). A negative result must be combined with clinical observations, patient history, and epidemiological information. The expected result is Negative.  Fact Sheet for Patients:  EntrepreneurPulse.com.au  Fact Sheet for Healthcare Providers:  IncredibleEmployment.be  This test is no t yet approved or cleared by the [place] FDA and  has been authorized for detection and/or diagnosis of SARS-CoV-2 by FDA under an Emergency Use Authorization (EUA).  This EUA will remain  in effect (meaning this test can be used) for the duration of the COVID-19 declaration under Section 564(b)(1) of the Act, 21 U.S.C.section 360bbb-3(b)(1), unless the authorization is terminated  or revoked sooner.       Influenza A by PCR NEGATIVE NEGATIVE Final   Influenza B by PCR NEGATIVE NEGATIVE Final    Comment: (NOTE) The Xpert Xpress SARS-CoV-2/FLU/RSV plus assay is intended as an aid in the diagnosis of influenza from Nasopharyngeal swab specimens and should not be used as a sole basis for treatment. Nasal washings and aspirates are unacceptable for Xpert Xpress SARS-CoV-2/FLU/RSV testing.  Fact Sheet for Patients: EntrepreneurPulse.com.au  Fact Sheet for Healthcare Providers: IncredibleEmployment.be  This test is not yet approved or cleared by the [place] FDA and has been authorized for detection and/or diagnosis of SARS-CoV-2 by FDA under an Emergency Use Authorization (EUA). This EUA will remain in effect (meaning this test can be used) for the duration of the COVID-19 declaration under Section 564(b)(1) of the Act, 21 U.S.C. section 360bbb-3(b)(1), unless the authorization is terminated or revoked.  Performed at [facility], [address] [place], [place] [address]   MRSA Next Gen by PCR, Nasal     Status: None  Collection Time: 09/24/21 10:32 PM   Specimen: Nasal Mucosa; Nasal Swab  Result Value Ref Range Status   MRSA by PCR Next Gen NOT DETECTED NOT DETECTED Final    Comment: (NOTE) The GeneXpert MRSA Assay (FDA approved for NASAL specimens only), is one component of a comprehensive MRSA colonization surveillance program. It is not intended to diagnose MRSA infection nor to guide or monitor treatment for MRSA infections. Test performance is not FDA approved in patients less than 61 years old. Performed at [facility], [place] [address], [place], [place]  [address]   Blood culture (routine x 2)     Status: None (Preliminary result)   Collection Time: 09/24/21 11:15 PM   Specimen: BLOOD LEFT WRIST  Result Value Ref Range Status   Specimen Description   Final    BLOOD LEFT WRIST Performed at [facility] [address], [place], [place] [address]    Special Requests   Final    BOTTLES DRAWN AEROBIC ONLY Blood Culture adequate volume Performed at [facility] [address], [place], [place] [address]    Culture   Final    NO GROWTH 2 DAYS Performed at [facility] [address], [place], [place] [address]    Report Status PENDING  Incomplete  Blood culture (routine x 2)     Status: None (Preliminary result)   Collection Time: 09/24/21 11:15 PM   Specimen: BLOOD LEFT HAND  Result Value Ref Range Status   Specimen Description   Final    BLOOD LEFT HAND Performed at [facility], [place] [address], [place], [place] [address]    Special Requests   Final    BOTTLES DRAWN AEROBIC ONLY Blood Culture adequate volume Performed at [facility] [address], [place], [place] [address]    Culture   Final    NO GROWTH 2 DAYS Performed at [facility] [address] [place],  [address]    Report Status PENDING  Incomplete     Labs: Basic Metabolic Panel: Recent Labs  Lab 09/25/21 0050 09/25/21 0335 09/25/21 1455 09/25/21 1949 09/27/21 0451  NA 144 145 137 137 136  K 4.2 4.1 4.1 3.3* 3.5  CL 111 113* 105 107 104  CO2 14* 15* 19* 21* 20*  GLUCOSE 223* 208* 313* 350* 332*  BUN _0 CREATININE 1.28* 1.17 1.06 0.90 0.94  CALCIUM 10.0 9.9 9.3 9.2 8.6*   Liver Function Tests: Recent Labs  Lab 09/24/21 1745  AST 19  ALT 38  ALKPHOS 114  BILITOT 1.6*  PROT 9.5*  ALBUMIN 4.9   CBC: Recent Labs  Lab 09/24/21 1745 09/24/21 1901 09/26/21 0745  WBC 11.8*  --  8.5  NEUTROABS 9.7*  --   --   HGB 17.8* 17.7* 14.5  HCT 52.1* 52.0 40.5  MCV 89.7   --  87.9  PLT 373  --  186   CBG: Recent Labs  Lab 09/27/21 0738 09/27/21 1223 09/27/21 1639 09/27/21 2116 09/28/21 0713  GLUCAP 351* 367* 355* 429* 280*   Hgb A1c No results for input(s): HGBA1C in the last 72 hours. Lipid Profile No results for input(s): CHOL, HDL, LDLCALC, TRIG, CHOLHDL, LDLDIRECT in the last 72 hours. Thyroid function studies No results for input(s): TSH, T4TOTAL, T3FREE, THYROIDAB in the last 72 hours.  Invalid input(s): FREET3 Urinalysis    Component Value Date/Time   COLORURINE STRAW (A)  09/24/2021 [place] 09/24/2021 1745   LABSPEC 1.030 09/24/2021 1745   PHURINE 6.0 09/24/2021 1745   GLUCOSEU >=500 (A) 09/24/2021 1745   HGBUR MODERATE (A) 09/24/2021 1745   BILIRUBINUR NEGATIVE 09/24/2021 1745   KETONESUR >=80 (A) 09/24/2021 1745   PROTEINUR 30 (A) 09/24/2021 1745   NITRITE NEGATIVE 09/24/2021 1745   LEUKOCYTESUR NEGATIVE 09/24/2021 1745    FURTHER DISCHARGE INSTRUCTIONS:   Get Medicines reviewed and adjusted: Please take all your medications with you for your next visit with your Primary MD   Laboratory/radiological data: Please request your Primary MD to go over all hospital tests and procedure/radiological results at the follow up, please ask your Primary MD to get all Hospital records sent to his/her office.   In some cases, they will be blood work, cultures and biopsy results pending at the time of your discharge. Please request that your primary care M.D. goes through all the records of your hospital data and follows up on these results.   Also Note the following: If you experience worsening of your admission symptoms, develop shortness of breath, life threatening emergency, suicidal or homicidal thoughts you must seek medical attention immediately by calling 911 or calling your MD immediately  if symptoms less severe.   You must read complete instructions/literature along with all the possible adverse reactions/side  effects for all the Medicines you take and that have been prescribed to you. Take any new Medicines after you have completely understood and accpet all the possible adverse reactions/side effects.    Do not drive when taking Pain medications or sleeping medications (Benzodaizepines)   Do not take more than prescribed Pain, Sleep and Anxiety Medications. It is not advisable to combine anxiety,sleep and pain medications without talking with your primary care practitioner   Special Instructions: If you have smoked or chewed Tobacco  in the last 2 yrs please stop smoking, stop any regular Alcohol  and or any Recreational drug use.   Wear Seat belts while driving.   Please note: You were cared for by a hospitalist during your hospital stay. Once you are discharged, your primary care physician will handle any further medical issues. Please note that NO REFILLS for any discharge medications will be authorized once you are discharged, as it is imperative that you return to your primary care physician (or establish a relationship with a primary care physician if you do not have one) for your post hospital discharge needs so that they can reassess your need for medications and monitor your lab values.  Time coordinating discharge: 35 minutes  SIGNED:  Marzetta Board, MD, PhD 09/28/2021, 8:04 AM

## 2021-09-28 NOTE — Progress Notes (Signed)
PROGRESS NOTE  Charles Petersen VHQ:[id number] DOB: [birth year] DOA: 09/24/2021 PCP: [name], MD (Inactive)   LOS: 3 days   Brief Narrative / Interim history: 20 year old male with history of obesity, prediabetes on metformin who came with nausea, vomiting, abdominal pain.  This is been going on for the past 4 days, he was found to be in DKA, placed on insulin infusion and admitted to stepdown  Subjective / 24h Interval events: Still with elevated CBGs. No complaints  Assessment & Plan: Principal Problem DKA, poorly controlled diabetes mellitus with hyperglycemia- A1c was found to be 11.4.  CBGs p[oorly controlled still, continue to make adjustments  Active Problems Essential hypertension-persistently elevated blood pressure, continue amlodipine  Acute kidney injury-due to #1, dehydration.  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  Scheduled Meds:  amLODipine  5 mg Oral Daily   Chlorhexidine Gluconate Cloth  6 each Topical Daily   enoxaparin (LOVENOX) injection  80 mg Subcutaneous Q24H   insulin aspart  0-15 Units Subcutaneous TID WC   insulin aspart  0-5 Units Subcutaneous QHS   insulin aspart  14 Units Subcutaneous TID WC   insulin glargine-yfgn  40 Units Subcutaneous Daily   Continuous Infusions: PRN Meds:.bisacodyl, dextrose, diphenhydrAMINE-zinc acetate, ondansetron (ZOFRAN) IV  Diet Orders (From admission, onward)     Start     Ordered   09/25/21 1040  Diet Carb Modified Fluid consistency: Thin; Room service appropriate? Yes  Diet effective now       Question Answer Comment  Diet-HS Snack? Nothing   Calorie Level Medium 1600-2000   Fluid consistency: Thin   Room service appropriate? Yes      09/25/21 1039            DVT prophylaxis:      Code Status: Full Code  Family Communication: no family at bedside   Status is: Inpatient  Remains inpatient appropriate because:IV treatments appropriate due to intensity of illness or inability to  take PO  Dispo: The patient is from: Home              Anticipated d/c is to: Home              Patient currently is not medically stable to d/c.   Difficult to place patient No  Level of care: Med-Surg  Consultants:  None   Procedures:  None   Microbiology  None   Antimicrobials: None     Objective: Vitals:   09/27/21 0740 09/27/21 1221 09/27/21 2029 09/28/21 0503  BP: (!) 143/79 (!) 150/88 (!) 167/107 (!) 157/91  Pulse: 96 98 (!) 110 94  Resp:   20 20  Temp: 98.5 F (36.9 C)  98 F (36.7 C) 98.1 F (36.7 C)  TempSrc: Oral  Oral Oral  SpO2: 100% 99% 100% 100%  Weight:      Height:       No intake or output data in the 24 hours ending 09/28/21 0705  Filed Weights   09/24/21 1641  Weight: (!) 163.3 kg    Examination:  Constitutional: NAD Eyes: anicteric ENMT: mmm Neck: normal, supple Respiratory: cta, no wheezing Cardiovascular: rrr, no mrg, no edema Abdomen: nt, nd, bs+ Musculoskeletal: no clubbing / cyanosis.  Skin: no rashes Neurologic: non focal  Data Reviewed: I have independently reviewed following labs and imaging studies   CBC: Recent Labs  Lab 09/24/21 1745 09/24/21 1901 09/26/21 0745  WBC 11.8*  --  8.5  NEUTROABS 9.7*  --   --  HGB 17.8* 17.7* 14.5  HCT 52.1* 52.0 40.5  MCV 89.7  --  87.9  PLT 373  --  186    Basic Metabolic Panel: Recent Labs  Lab 09/25/21 0050 09/25/21 0335 09/25/21 1455 09/25/21 1949 09/27/21 0451  NA 144 145 137 137 136  K 4.2 4.1 4.1 3.3* 3.5  CL 111 113* 105 107 104  CO2 14* 15* 19* 21* 20*  GLUCOSE 223* 208* 313* 350* 332*  BUN 18 19 15 15 13   CREATININE 1.28* 1.17 1.06 0.90 0.94  CALCIUM 10.0 9.9 9.3 9.2 8.6*    Liver Function Tests: Recent Labs  Lab 09/24/21 1745  AST 19  ALT 38  ALKPHOS 114  BILITOT 1.6*  PROT 9.5*  ALBUMIN 4.9    Coagulation Profile: No results for input(s): INR, PROTIME in the last 168 hours. HbA1C: No results for input(s): HGBA1C in the last 72  hours.  CBG: Recent Labs  Lab 09/26/21 2202 09/27/21 0738 09/27/21 1223 09/27/21 1639 09/27/21 2116  GLUCAP 361* 351* 367* 355* 429*     Recent Results (from the past 240 hour(s))  Resp Panel by RT-PCR (Flu A&B, Covid) Nasopharyngeal Swab     Status: None   Collection Time: 09/24/21  9:25 PM   Specimen: Nasopharyngeal Swab; Nasopharyngeal(NP) swabs in vial transport medium  Result Value Ref Range Status   SARS Coronavirus 2 by RT PCR NEGATIVE NEGATIVE Final    Comment: (NOTE) SARS-CoV-2 target nucleic acids are NOT DETECTED.  The SARS-CoV-2 RNA is generally detectable in upper respiratory specimens during the acute phase of infection. The lowest concentration of SARS-CoV-2 viral copies this assay can detect is 138 copies/mL. A negative result does not preclude SARS-Cov-2 infection and should not be used as the sole basis for treatment or other patient management decisions. A negative result may occur with  improper specimen collection/handling, submission of specimen other than nasopharyngeal swab, presence of viral mutation(s) within the areas targeted by this assay, and inadequate number of viral copies(<138 copies/mL). A negative result must be combined with clinical observations, patient history, and epidemiological information. The expected result is Negative.  Fact Sheet for Patients:  09/26/21  Fact Sheet for Healthcare Providers:  BloggerCourse.com  This test is no t yet approved or cleared by the [place] FDA and  has been authorized for detection and/or diagnosis of SARS-CoV-2 by FDA under an Emergency Use Authorization (EUA). This EUA will remain  in effect (meaning this test can be used) for the duration of the COVID-19 declaration under Section 564(b)(1) of the Act, 21 U.S.C.section 360bbb-3(b)(1), unless the authorization is terminated  or revoked sooner.       Influenza A by PCR NEGATIVE  NEGATIVE Final   Influenza B by PCR NEGATIVE NEGATIVE Final    Comment: (NOTE) The Xpert Xpress SARS-CoV-2/FLU/RSV plus assay is intended as an aid in the diagnosis of influenza from Nasopharyngeal swab specimens and should not be used as a sole basis for treatment. Nasal washings and aspirates are unacceptable for Xpert Xpress SARS-CoV-2/FLU/RSV testing.  Fact Sheet for Patients: [place]  Fact Sheet for Healthcare Providers: BloggerCourse.com  This test is not yet approved or cleared by the [place] FDA and has been authorized for detection and/or diagnosis of SARS-CoV-2 by FDA under an Emergency Use Authorization (EUA). This EUA will remain in effect (meaning this test can be used) for the duration of the COVID-19 declaration under Section 564(b)(1) of the Act, 21 U.S.C. section 360bbb-3(b)(1), unless the authorization is terminated  or revoked.  Performed at [facility], [address] [place], [place] [address]   MRSA Next Gen by PCR, Nasal     Status: None   Collection Time: 09/24/21 10:32 PM   Specimen: Nasal Mucosa; Nasal Swab  Result Value Ref Range Status   MRSA by PCR Next Gen NOT DETECTED NOT DETECTED Final    Comment: (NOTE) The GeneXpert MRSA Assay (FDA approved for NASAL specimens only), is one component of a comprehensive MRSA colonization surveillance program. It is not intended to diagnose MRSA infection nor to guide or monitor treatment for MRSA infections. Test performance is not FDA approved in patients less than 40 years old. Performed at [facility], [address], [place], [place] [address]   Blood culture (routine x 2)     Status: None (Preliminary result)   Collection Time: 09/24/21 11:15 PM   Specimen: BLOOD LEFT WRIST  Result Value Ref Range Status   Specimen Description   Final    BLOOD LEFT WRIST Performed at [facility], [address], [place], [place] [address]    Special Requests   Final    BOTTLES DRAWN AEROBIC ONLY Blood Culture adequate volume Performed at [facility], [address], [place], [place] [address]    Culture   Final    NO GROWTH 2 DAYS Performed at [facility], [address] [place], [place] [address]    Report Status PENDING  Incomplete  Blood culture (routine x 2)     Status: None (Preliminary result)   Collection Time: 09/24/21 11:15 PM   Specimen: BLOOD LEFT HAND  Result Value Ref Range Status   Specimen Description   Final    BLOOD LEFT HAND Performed at [facility], [address], [place], [place] [address]    Special Requests   Final    BOTTLES DRAWN AEROBIC ONLY Blood Culture adequate volume Performed at [facility], [address], [place], [place] [address]    Culture   Final    NO GROWTH 2 DAYS Performed at [facility], [address] [place], [place] [address]    Report Status PENDING  Incomplete      Radiology Studies: No results found.   Pamella Pert, MD, PhD [facility]  Between 7 am - 7 pm I am available, please contact me via Amion (for emergencies) or Securechat (non urgent messages)  Between 7 pm - 7 am I am not available, please contact night coverage MD/APP via Amion

## 2021-09-30 LAB — CULTURE, BLOOD (ROUTINE X 2)
Culture: NO GROWTH
Culture: NO GROWTH
Special Requests: ADEQUATE
Special Requests: ADEQUATE

## 2023-04-13 IMAGING — DX DG CHEST 2V
3 series · 3 of 3 positions shown · non-contrast
Comparison: None.

CLINICAL DATA: Nausea, vomiting, dizziness

EXAM:
CHEST - 2 VIEW

[chest lat (1 of 2)]
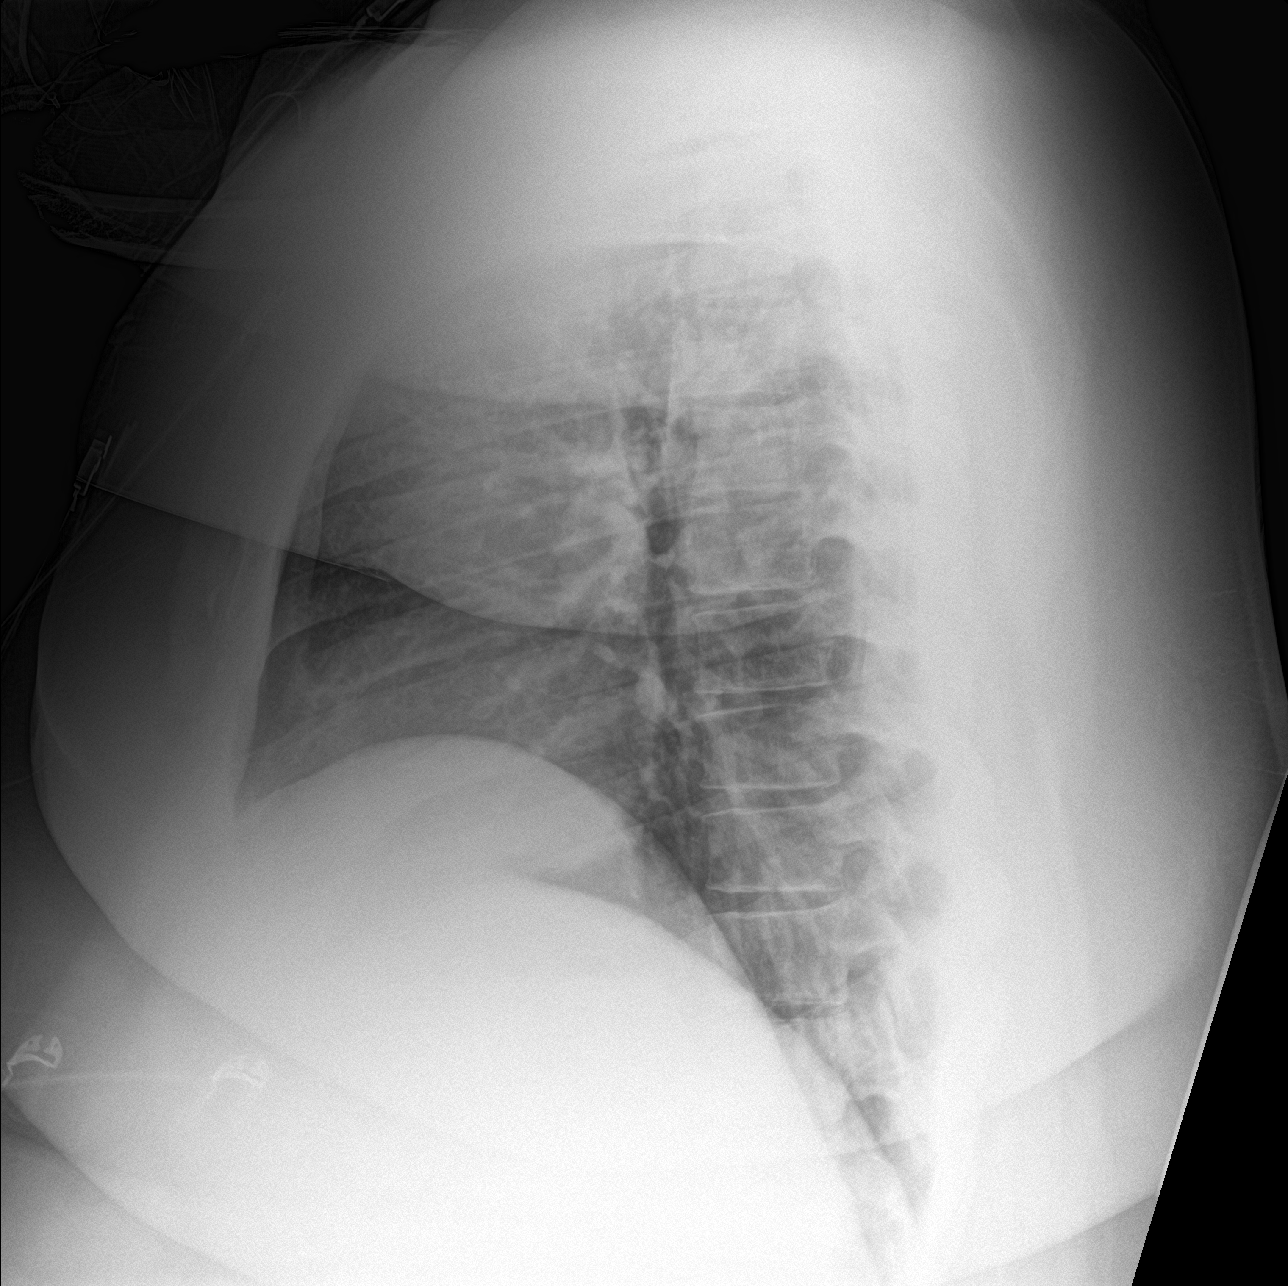

[chest ap]
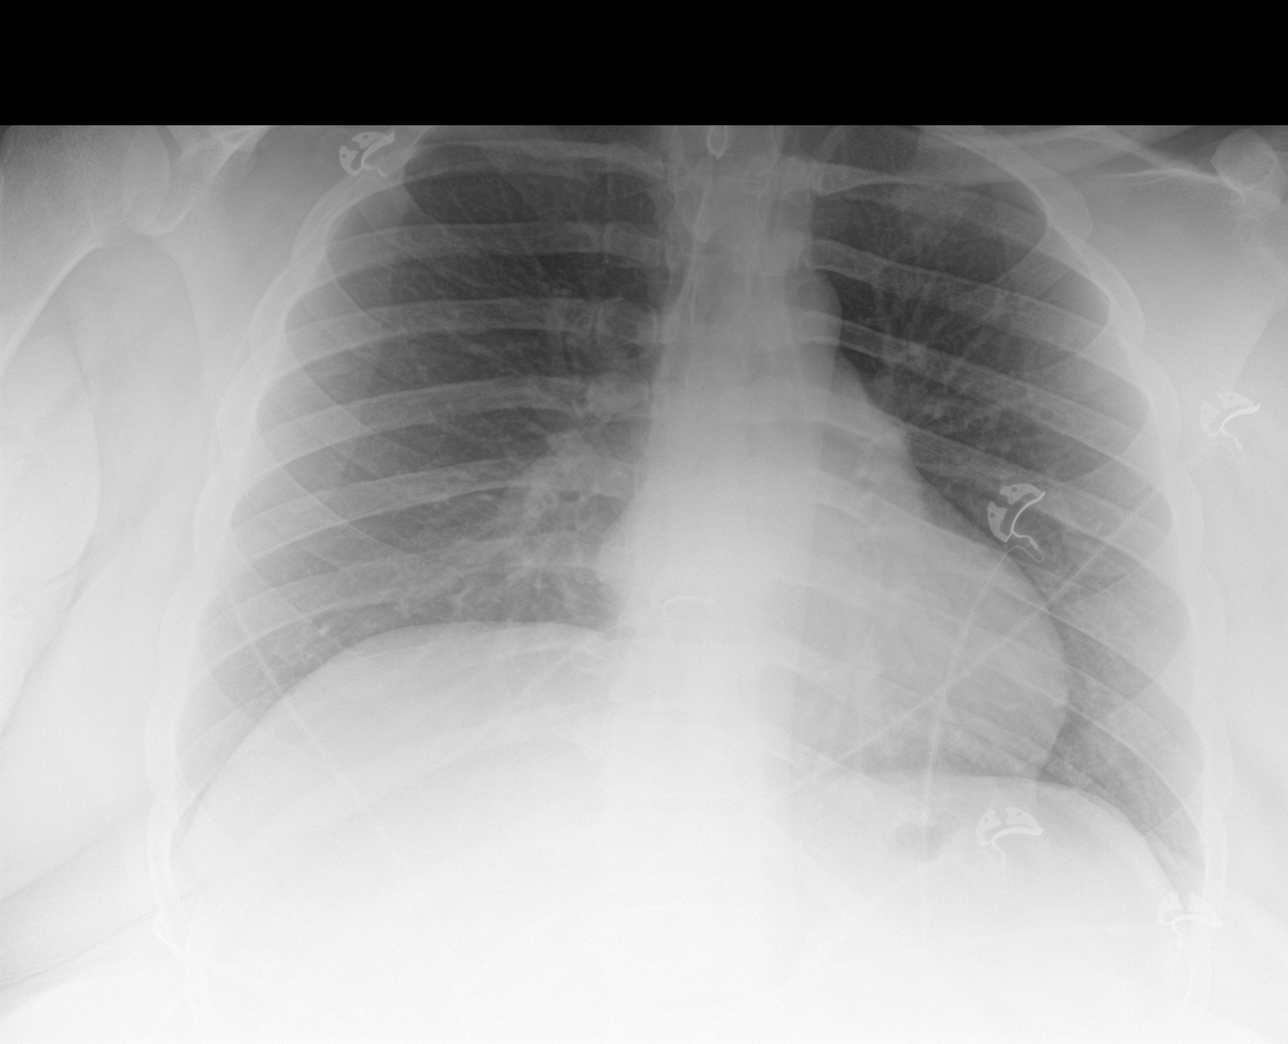

[chest lat (2 of 2)]
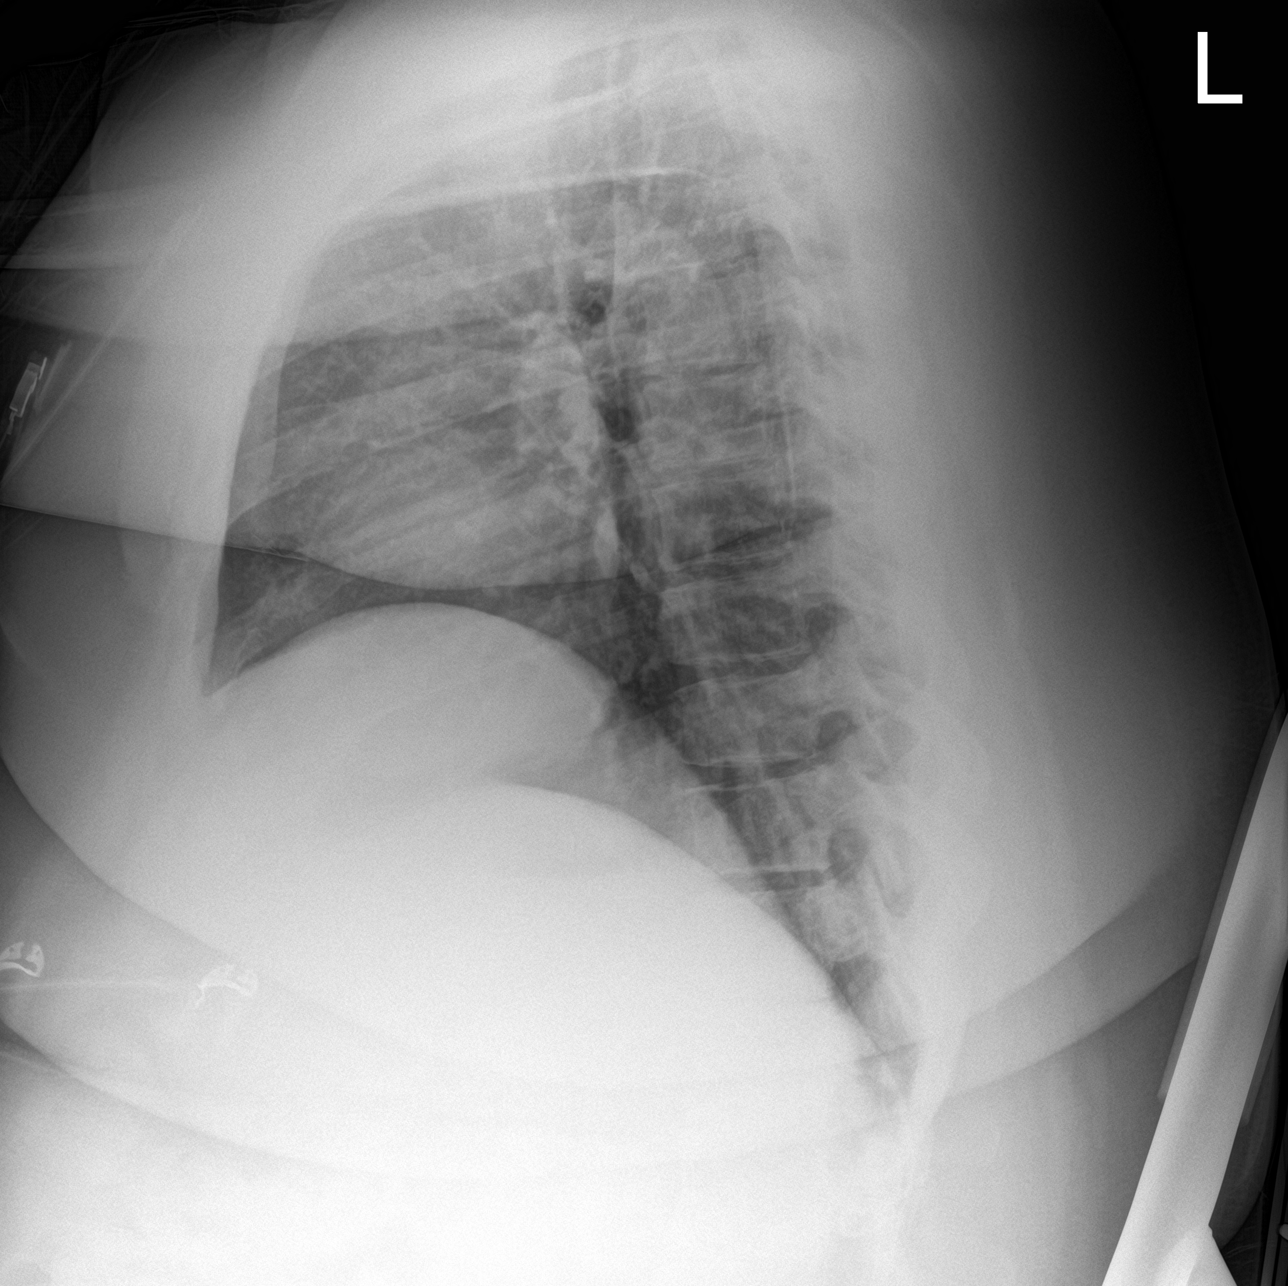

[3 of 3 positions shown; findings below may reference images not displayed]

FINDINGS: The cardiomediastinal silhouette is normal. There is no focal
consolidation or pulmonary edema. There is no pleural effusion or
pneumothorax.

There is no acute osseous abnormality.
IMPRESSION: No radiographic evidence of acute cardiopulmonary process.
